# Patient Record
Sex: Male | Born: 1985 | Race: White | Hispanic: No | Marital: Married | Smoking: Former smoker
Health system: Southern US, Community
[De-identification: ages and names within clinical notes are randomized; demographics above are authoritative.]

## PROBLEM LIST (undated history)

## (undated) HISTORY — PX: FRACTURE SURGERY: SHX138

---
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
## 2003-05-23 ENCOUNTER — Emergency Department (HOSPITAL_COMMUNITY): Admission: EM | Admit: 2003-05-23 | Discharge: 2003-05-23 | Payer: Self-pay | Admitting: Emergency Medicine

## 2003-08-21 ENCOUNTER — Emergency Department (HOSPITAL_COMMUNITY): Admission: EM | Admit: 2003-08-21 | Discharge: 2003-08-21 | Payer: Self-pay

## 2004-04-05 ENCOUNTER — Emergency Department (HOSPITAL_COMMUNITY): Admission: EM | Admit: 2004-04-05 | Discharge: 2004-04-05 | Payer: Self-pay | Admitting: Emergency Medicine

## 2004-06-13 ENCOUNTER — Emergency Department (HOSPITAL_COMMUNITY): Admission: EM | Admit: 2004-06-13 | Discharge: 2004-06-14 | Payer: Self-pay | Admitting: *Deleted

## 2004-12-08 ENCOUNTER — Emergency Department (HOSPITAL_COMMUNITY): Admission: EM | Admit: 2004-12-08 | Discharge: 2004-12-08 | Payer: Self-pay | Admitting: Emergency Medicine

## 2006-02-23 ENCOUNTER — Emergency Department (HOSPITAL_COMMUNITY): Admission: EM | Admit: 2006-02-23 | Discharge: 2006-02-23 | Payer: Self-pay | Admitting: Emergency Medicine

## 2007-07-23 ENCOUNTER — Emergency Department (HOSPITAL_COMMUNITY): Admission: EM | Admit: 2007-07-23 | Discharge: 2007-07-23 | Payer: Self-pay | Admitting: Emergency Medicine

## 2008-07-08 ENCOUNTER — Encounter: Payer: Self-pay | Admitting: Orthopedic Surgery

## 2008-07-08 ENCOUNTER — Emergency Department (HOSPITAL_COMMUNITY): Admission: EM | Admit: 2008-07-08 | Discharge: 2008-07-08 | Payer: Self-pay | Admitting: Emergency Medicine

## 2008-07-14 ENCOUNTER — Ambulatory Visit: Payer: Self-pay | Admitting: Orthopedic Surgery

## 2008-07-14 DIAGNOSIS — M549 Dorsalgia, unspecified: Secondary | ICD-10-CM | POA: Insufficient documentation

## 2008-07-15 ENCOUNTER — Encounter: Payer: Self-pay | Admitting: Orthopedic Surgery

## 2008-07-28 ENCOUNTER — Encounter (HOSPITAL_COMMUNITY): Admission: RE | Admit: 2008-07-28 | Discharge: 2008-08-27 | Payer: Self-pay | Admitting: Orthopedic Surgery

## 2008-08-03 ENCOUNTER — Encounter: Payer: Self-pay | Admitting: Orthopedic Surgery

## 2008-08-04 ENCOUNTER — Encounter: Payer: Self-pay | Admitting: Orthopedic Surgery

## 2008-08-13 ENCOUNTER — Encounter (INDEPENDENT_AMBULATORY_CARE_PROVIDER_SITE_OTHER): Payer: Self-pay | Admitting: *Deleted

## 2010-08-23 ENCOUNTER — Emergency Department (HOSPITAL_COMMUNITY)
Admission: EM | Admit: 2010-08-23 | Discharge: 2010-08-23 | Disposition: A | Discharge status: Home / Self Care | Payer: Self-pay | Attending: Emergency Medicine | Admitting: Emergency Medicine

## 2010-08-23 ENCOUNTER — Emergency Department (HOSPITAL_COMMUNITY): Payer: Self-pay

## 2010-08-23 DIAGNOSIS — Z87891 Personal history of nicotine dependence: Secondary | ICD-10-CM | POA: Insufficient documentation

## 2010-08-23 DIAGNOSIS — R51 Headache: Secondary | ICD-10-CM | POA: Insufficient documentation

## 2011-05-19 ENCOUNTER — Encounter (HOSPITAL_COMMUNITY): Payer: Self-pay | Admitting: *Deleted

## 2011-05-19 ENCOUNTER — Emergency Department (HOSPITAL_COMMUNITY)
Admission: EM | Admit: 2011-05-19 | Discharge: 2011-05-19 | Disposition: A | Discharge status: Home / Self Care | Payer: Self-pay | Attending: Emergency Medicine | Admitting: Emergency Medicine

## 2011-05-19 ENCOUNTER — Other Ambulatory Visit: Payer: Self-pay

## 2011-05-19 ENCOUNTER — Emergency Department (HOSPITAL_COMMUNITY): Payer: Self-pay

## 2011-05-19 DIAGNOSIS — J45909 Unspecified asthma, uncomplicated: Secondary | ICD-10-CM | POA: Insufficient documentation

## 2011-05-19 DIAGNOSIS — R091 Pleurisy: Secondary | ICD-10-CM | POA: Insufficient documentation

## 2011-05-19 DIAGNOSIS — F17200 Nicotine dependence, unspecified, uncomplicated: Secondary | ICD-10-CM | POA: Insufficient documentation

## 2011-05-19 DIAGNOSIS — R079 Chest pain, unspecified: Secondary | ICD-10-CM | POA: Insufficient documentation

## 2011-05-19 MED ORDER — HYDROCODONE-ACETAMINOPHEN 5-325 MG PO TABS: 2.0000 | ORAL_TABLET | ORAL | Status: AC | PRN

## 2011-05-19 MED ORDER — IBUPROFEN 600 MG PO TABS: 600.0000 mg | ORAL_TABLET | Freq: Four times a day (QID) | ORAL | Status: AC | PRN

## 2011-05-19 NOTE — Discharge Instructions (Signed)
Pleurisy  Pleurisy is an inflammation and swelling of the lining of the lungs. It usually is the result of an underlying infection or other disease. Because of this inflammation, it hurts to breathe. It is aggravated by coughing or deep breathing. The primary goal in treating pleurisy is to diagnose and treat the condition that caused it.   HOME CARE INSTRUCTIONS    Only take over-the-counter or prescription medicines for pain, discomfort, or fever as directed by your caregiver.   If medications which kill germs (antibiotics) were prescribed, take the entire course. Even if you are feeling better, you need to take them.   Use a cool mist vaporizer to help loosen secretions. This is so the secretions can be coughed up more easily.  SEEK MEDICAL CARE IF:    Your pain is not controlled with medication or is increasing.   You have an increase inpus like (purulent) secretions brought up with coughing.  SEEK IMMEDIATE MEDICAL CARE IF:    You have blue or dark lips, fingernails, or toenails.   You begin coughing up blood.   You have increased difficulty breathing.   You have continuing pain unrelieved by medicine or lasting more than 1 week.   You have pain that radiates into your neck, arms, or jaw.   You develop increased shortness of breath or wheezing.   You develop a fever, rash, vomiting, fainting, or other serious complaints.  Document Released: 02/06/2005 Document Revised: 01/26/2011 Document Reviewed: 09/07/2006  [employer] Patient Information 2012 [employer].

## 2011-05-19 NOTE — ED Provider Notes (Signed)
History     CSN: [id number]  Arrival date & time 05/19/11  9562   First MD Initiated Contact with Patient 05/19/11 1153      Chief Complaint  Patient presents with  . Chest Pain     HPI Pt reports progressively worsening left sided rib cage discomfort x 3 days. Pt reports increases with deep breathing and standing up straight.  Pain worse with deep breathing and coughing.  No fever cough no wheezing.  No history of ACS or cardiac abnormalities.  Patient is a smoker.  No nausea vomiting.  No diaphoresis.  Past Medical History  Diagnosis Date  . Asthma     History reviewed. No pertinent past surgical history.  History reviewed. No pertinent family history.  History  Substance Use Topics  . Smoking status: Current Everyday Smoker  . Smokeless tobacco: Not on file  . Alcohol Use: No      Review of Systems  All other systems reviewed and are negative.    Allergies  Review of patient's allergies indicates no known allergies.  Home Medications   Current Outpatient Rx  Name Route Sig Dispense Refill  . IBUPROFEN 200 MG PO TABS Oral Take 800 mg by mouth every 6 (six) hours as needed.    Marland Kitchen HYDROCODONE-ACETAMINOPHEN 5-325 MG PO TABS Oral Take 2 tablets by mouth every 4 (four) hours as needed for pain. 10 tablet 0  . IBUPROFEN 600 MG PO TABS Oral Take 1 tablet (600 mg total) by mouth every 6 (six) hours as needed for pain. 30 tablet 0    BP 126/78  Pulse 75  Temp(Src) 98.1 F (36.7 C) (Oral)  Resp 24  SpO2 96%  Physical Exam  Nursing note and vitals reviewed. Constitutional: He is oriented to person, place, and time. He appears well-developed and well-nourished. No distress.  HENT:  Head: Normocephalic and atraumatic.  Eyes: Pupils are equal, round, and reactive to light.  Neck: Normal range of motion.  Cardiovascular: Normal rate and intact distal pulses.         Date: 05/19/2011  Rate: 78  Rhythm: normal sinus rhythm  QRS Axis: normal  Intervals: normal  ST/T Wave abnormalities: normal  Conduction Disutrbances: none  Narrative Interpretation: unremarkable      Pulmonary/Chest: No respiratory distress. He has no wheezes. He has no rales.      Abdominal: Normal appearance. He exhibits no distension.  Musculoskeletal: Normal range of motion.  Neurological: He is alert and oriented to person, place, and time. No cranial nerve deficit.  Skin: Skin is warm and dry. No rash noted.  Psychiatric: He has a normal mood and affect. His behavior is normal.    ED Course  Procedures (including critical care time)   Patient is PERC negative   Labs Reviewed - No data to display Dg Chest 2 View  05/19/2011  *RADIOLOGY REPORT*  Clinical Data: Left rib pain and chest pain.  CHEST - 2 VIEW  Comparison: None.  Findings: Bilateral nipple adornments are present. Cardiopericardial silhouette appears within normal limits.  No airspace disease.  No effusion.  Trachea midline.  There is no pneumothorax.  No displaced rib fractures are identified.  IMPRESSION: No active cardiopulmonary disease.  Original Report Authenticated By: Andreas Newport, M.D.     1. Pleurisy       MDM          Nelia Shi, MD 05/19/11 [phone]

## 2011-05-19 NOTE — ED Notes (Signed)
Pt reports progressively worsening left sided rib cage discomfort x 3 days. Pt reports increases with deep breathing and standing up straight.

## 2012-10-26 ENCOUNTER — Encounter (HOSPITAL_COMMUNITY): Payer: Self-pay | Admitting: *Deleted

## 2012-10-26 ENCOUNTER — Emergency Department (HOSPITAL_COMMUNITY): Payer: BC Managed Care – PPO

## 2012-10-26 ENCOUNTER — Emergency Department (HOSPITAL_COMMUNITY)
Admission: EM | Admit: 2012-10-26 | Discharge: 2012-10-26 | Disposition: A | Discharge status: Home / Self Care | Payer: BC Managed Care – PPO | Attending: Emergency Medicine | Admitting: Emergency Medicine

## 2012-10-26 DIAGNOSIS — S8990XA Unspecified injury of unspecified lower leg, initial encounter: Secondary | ICD-10-CM | POA: Insufficient documentation

## 2012-10-26 DIAGNOSIS — Y9239 Other specified sports and athletic area as the place of occurrence of the external cause: Secondary | ICD-10-CM | POA: Insufficient documentation

## 2012-10-26 DIAGNOSIS — X500XXA Overexertion from strenuous movement or load, initial encounter: Secondary | ICD-10-CM | POA: Insufficient documentation

## 2012-10-26 DIAGNOSIS — Y9367 Activity, basketball: Secondary | ICD-10-CM | POA: Insufficient documentation

## 2012-10-26 DIAGNOSIS — S8992XA Unspecified injury of left lower leg, initial encounter: Secondary | ICD-10-CM

## 2012-10-26 DIAGNOSIS — J45901 Unspecified asthma with (acute) exacerbation: Secondary | ICD-10-CM | POA: Insufficient documentation

## 2012-10-26 DIAGNOSIS — F17200 Nicotine dependence, unspecified, uncomplicated: Secondary | ICD-10-CM | POA: Insufficient documentation

## 2012-10-26 MED ORDER — KETOROLAC TROMETHAMINE 10 MG PO TABS
10.0000 mg | ORAL_TABLET | Freq: Once | ORAL | Status: AC
  Administered 2012-10-26: 10 mg via ORAL
  Filled 2012-10-26: qty 1

## 2012-10-26 MED ORDER — HYDROCODONE-ACETAMINOPHEN 7.5-325 MG PO TABS: 1.0000 | ORAL_TABLET | ORAL | Status: DC | PRN

## 2012-10-26 MED ORDER — ONDANSETRON HCL 4 MG PO TABS
4.0000 mg | ORAL_TABLET | Freq: Once | ORAL | Status: AC
  Administered 2012-10-26: 4 mg via ORAL
  Filled 2012-10-26: qty 1

## 2012-10-26 MED ORDER — HYDROCODONE-ACETAMINOPHEN 5-325 MG PO TABS
2.0000 | ORAL_TABLET | Freq: Once | ORAL | Status: AC
  Administered 2012-10-26: 2 via ORAL
  Filled 2012-10-26: qty 2

## 2012-10-26 MED ORDER — DICLOFENAC SODIUM 75 MG PO TBEC: 75.0000 mg | DELAYED_RELEASE_TABLET | Freq: Two times a day (BID) | ORAL | Status: DC

## 2012-10-26 NOTE — ED Notes (Signed)
Pt c/o bending left knee backwards while playing basketball last night, positive distal pulse,

## 2012-10-26 NOTE — ED Notes (Signed)
Pt alert & oriented x4, stable gait with crutches. Patient given discharge instructions, paperwork & prescription(s). Patient instructed to stop at the registration desk to finish any additional paperwork. Patient verbalized understanding. Pt left department w/ no further questions.

## 2012-10-26 NOTE — ED Provider Notes (Signed)
Medical screening examination/treatment/procedure(s) were performed by non-physician practitioner and as supervising physician I was immediately available for consultation/collaboration.  Nikesha Kwasny, MD 10/26/12 1537 

## 2012-10-26 NOTE — ED Notes (Signed)
Pt reports was playing basketball last night and when he landed on feet his left knee went "backwards."

## 2012-10-26 NOTE — ED Provider Notes (Signed)
CSN: [id number]     Arrival date & time 10/26/12  0735 History   First MD Initiated Contact with Patient 10/26/12 0801     Chief Complaint  Patient presents with  . Knee Pain   (Consider location/radiation/quality/duration/timing/severity/associated sxs/prior Treatment) Patient is a 27 y.o. male presenting with knee pain. The history is provided by the patient.  Knee Pain Location:  Knee Time since incident:  1 day Injury: yes   Mechanism of injury comment:  Stretched left knee backwards while playing basketball Knee location:  L knee Pain details:    Quality:  Aching   Radiates to:  Does not radiate   Severity:  Moderate   Onset quality:  Sudden   Duration:  1 day   Timing:  Constant   Progression:  Worsening Chronicity:  New Dislocation: no   Foreign body present:  No foreign bodies Prior injury to area:  No Relieved by:  Nothing Ineffective treatments:  NSAIDs Associated symptoms: decreased ROM and stiffness   Associated symptoms: no back pain, no neck pain, no numbness and no tingling     Past Medical History  Diagnosis Date  . Asthma    History reviewed. No pertinent past surgical history. No family history on file. History  Substance Use Topics  . Smoking status: Current Every Day Smoker  . Smokeless tobacco: Not on file  . Alcohol Use: No    Review of Systems  Constitutional: Negative for activity change.       All ROS Neg except as noted in HPI  HENT: Negative for nosebleeds and neck pain.   Eyes: Negative for photophobia and discharge.  Respiratory: Positive for wheezing. Negative for cough and shortness of breath.   Cardiovascular: Negative for chest pain and palpitations.  Gastrointestinal: Negative for abdominal pain and blood in stool.  Genitourinary: Negative for dysuria, frequency and hematuria.  Musculoskeletal: Positive for arthralgias and stiffness. Negative for back pain.  Skin: Negative.   Neurological: Negative for dizziness, seizures and  speech difficulty.  Psychiatric/Behavioral: Negative for hallucinations and confusion.    Allergies  Shellfish allergy  Home Medications   Current Outpatient Rx  Name  Route  Sig  Dispense  Refill  . ibuprofen (ADVIL,MOTRIN) 200 MG tablet   Oral   Take by mouth every 6 (six) hours as needed.           BP 101/61  Pulse 79  Temp(Src) 98.6 F (37 C) (Oral)  Resp 16  Ht 6' (1.829 m)  Wt 214 lb (97.07 kg)  BMI 29.02 kg/m2  SpO2 95% Physical Exam  Nursing note and vitals reviewed. Constitutional: He is oriented to person, place, and time. He appears well-developed and well-nourished.  Non-toxic appearance.  HENT:  Head: Normocephalic.  Right Ear: Tympanic membrane and external ear normal.  Left Ear: Tympanic membrane and external ear normal.  Eyes: EOM and lids are normal. Pupils are equal, round, and reactive to light.  Neck: Normal range of motion. Neck supple. Carotid bruit is not present.  Cardiovascular: Normal rate, regular rhythm, normal heart sounds, intact distal pulses and normal pulses.   Pulmonary/Chest: Breath sounds normal. No respiratory distress.  Abdominal: Soft. Bowel sounds are normal. There is no tenderness. There is no guarding.  Musculoskeletal: Normal range of motion.  There is full range of motion of the left hip. There is pain to palpation of the left knee. Medial more than lateral. There is some pain to palpation posteriorly. There is mild instability of the left  knee, examination is limited due to to patient's pain. The left knee is not hot. The Achilles tendon is intact. The dorsalis pedis pulses 2+.  Lymphadenopathy:       Head (right side): No submandibular adenopathy present.       Head (left side): No submandibular adenopathy present.    He has no cervical adenopathy.  Neurological: He is alert and oriented to person, place, and time. He has normal strength. No cranial nerve deficit or sensory deficit.  Skin: Skin is warm and dry.  Psychiatric:  He has a normal mood and affect. His speech is normal.    ED Course  Procedures (including critical care time) Labs Review Labs Reviewed - No data to display Imaging Review No results found.  MDM  No diagnosis found. **I have reviewed nursing notes, vital signs, and all appropriate lab and imaging results for this patient.* Patient was playing basketball when he hyperextended the left knee after coming down from jumping. He heard a pop, and was unable to finish the play. His examination question some mild instability of the knee, however this examination was limited due to the patient's pain. The x-ray of the left knee is negative for fracture or dislocation. I am concerned for possible ligament injury to this left knee. The patient is fitted with a knee immobilizer, crutches, an ice pack. Prescription for diclofenac and Norco given to the patient. Work note to clear the patient for work duty until cleared by orthopedics has been given to the patient.   Kathie Dike, PA-C 10/26/12 [phone]

## 2012-10-29 ENCOUNTER — Encounter: Payer: Self-pay | Admitting: Orthopedic Surgery

## 2012-10-29 ENCOUNTER — Ambulatory Visit (INDEPENDENT_AMBULATORY_CARE_PROVIDER_SITE_OTHER): Payer: BC Managed Care – PPO | Admitting: Orthopedic Surgery

## 2012-10-29 VITALS — BP 117/80 | Ht 72.0 in | Wt 212.0 lb

## 2012-10-29 DIAGNOSIS — IMO0002 Reserved for concepts with insufficient information to code with codable children: Secondary | ICD-10-CM

## 2012-10-29 DIAGNOSIS — S83242A Other tear of medial meniscus, current injury, left knee, initial encounter: Secondary | ICD-10-CM

## 2012-10-29 DIAGNOSIS — S83249A Other tear of medial meniscus, current injury, unspecified knee, initial encounter: Secondary | ICD-10-CM | POA: Insufficient documentation

## 2012-10-29 MED ORDER — DICLOFENAC SODIUM 75 MG PO TBEC: 75.0000 mg | DELAYED_RELEASE_TABLET | Freq: Two times a day (BID) | ORAL | Status: DC

## 2012-10-29 NOTE — Progress Notes (Signed)
Patient ID: James Hendricks, male   DOB: December 04, 1985, 27 y.o.   MRN: [id number]  Chief Complaint  Patient presents with  . Knee Pain    Left knee pain d/t injury 10/26/12    History date of injury August 5. Mechanism hyperextension while playing basketball. The patient planes of sharp dull throbbing stabbing for 10 constant pain and inability to weight-bear even with crutches and knee brace after injuring his left knee he heard a pop complains of swelling and lateral joint pain as well as joint effusion  Review of systems is negative except for shellfish and snoring  His medical history is recorded.  BP 117/80  Ht 6' (1.829 m)  Wt 212 lb (96.163 kg)  BMI 28.75 kg/m2 His appearance is normal he is oriented x3 his mood is normal his gait is normal.  Inspection reveals  moderate joint effusion painful range of motion 45 anterior cruciate ligament appears intact motor function is normal scans intact collateral ligaments are stable patella stable he is tender over the medial joint line his McMurray sign is positive for a medial  a medial meniscal tear skin is intact pulses normal good sensation is noted distal  X-rays are negative  Encounter Diagnosis  Name Primary?  . Acute medial meniscal tear, left, initial encounter Yes    Plan MRI continue ice brace and crutches anti-inflammatories follow up after MRI the patient most likely has a torn medial meniscus and will require surgery

## 2012-10-29 NOTE — Patient Instructions (Addendum)
MRI  OOW UNTIL MRI COMPLETED   IF THE MENISCUS IS TORN YOU MAY NEED SURGERY PLEASE READ BELOW   Meniscus Injury of the Knee, Arthroscopy You may have an internal derangement of the knee. This means something is wrong inside the knee. Your caregiver can make a more accurate diagnosis (learning what is wrong) by performing an arthroscopic procedure. Your knee has two layers of cartilage. Articular cartilage covers the bone ends. It lets your knee bend and move smoothly. Two menisci (thick pads of cartilage that form a rim inside the joint) help absorb shock. They stabilize your knee. Ligaments bind the bones together. They support your knee joint. Muscles move the joint, help support your knee, and take stress off the joint itself.  ABOUT THE PROCEDURE Arthroscopy is a surgical technique. It allows your orthopedic surgeon to diagnose and treat your knee injury with accuracy. The surgeon looks into your knee through a small scope. The scope is like a small (pencil-sized) telescope. Arthroscopy is less invasive than open knee surgery. You can expect a more rapid recovery. Following your caregiver's instructions will help you recover rapidly and completely. Use crutches, rest, elevate, ice, and do knee exercises as instructed. The length of recovery depends on various factors. These factors include type of injury, age, physical condition, medical conditions, and your determination. How long you will be away from your normal activities will depend on what kind of knee problem you have. It will also depend on how much tissue is damaged. Rebuilding your muscles after arthroscopy helps ensure a full recovery. RECOVERY Recovery after a meniscus injury depends on how much meniscus is damaged. It also depends on whether or not you have damaged other knee tissue. With small tears, your recovery may take a couple weeks. Larger tears will take longer. Meniscus injuries can usually be treated during arthroscopy. If  your injury is on the inner edge of the meniscus, your surgeon may trim the meniscus back to a smooth rim. In other cases, your surgeon will try to repair a damaged meniscus with sutures (stitches). This may lengthen your rehabilitation. It may provide better long-term health by helping your knee retain its shock absorption abilities. Use crutches, limit weight bearing, rest, elevate, apply ice, and exercise your knee as instructed. If a brace is applied, use as directed. The length of recovery depends on various factors including type of injury, age, physical condition, other medical conditions, and your determination. Your caregiver will help with instructions for rehabilitation of your knee. HOME CARE INSTRUCTIONS  Use crutches and knee exercises as instructed.  Applying an ice pack to your operative site may help with discomfort. It may also keep the swelling down.  Only take over-the-counter or prescription medicines for pain, discomfort, inflammation (soreness)or fever as directed by your caregiver. You may use these only if your caregiver has not given medications that would interfere.  You may resume normal diet and activities as directed. SEEK MEDICAL ATTENTION IF:  There is increased bleeding (more than a small spot) from the wound.  You notice redness, swelling, or increasing pain in the wound.  Pus is coming from wound.  An unexplained oral temperature above 102 F (38.9 C) develops, or as your caregiver suggests.  You notice a foul smell coming from the wound or dressing. SEEK IMMEDIATE MEDICAL CARE IF:  You develop a rash.  You have difficulty breathing.  You have any allergic problems. Document Released: 02/04/2000 Document Revised: 05/01/2011 Document Reviewed: 04/22/2007 [employer] Patient Information 2014  [employer]

## 2012-10-30 ENCOUNTER — Telehealth: Payer: Self-pay | Admitting: *Deleted

## 2012-10-30 NOTE — Telephone Encounter (Signed)
Authorization # [id number] per Christy Sartorius. With [employer] for CPT code 47829 Left Knee without contrast Valid through 10/30/12 - 11/28/12. Scheduled for 11/01/12 at 4:45 pm Follow up with Dr. Romeo Apple 11/12/12 at 3:45 pm Patient is aware of dates and times

## 2012-11-01 ENCOUNTER — Ambulatory Visit (HOSPITAL_COMMUNITY)
Admission: RE | Admit: 2012-11-01 | Discharge: 2012-11-01 | Disposition: A | Discharge status: Home / Self Care | Payer: BC Managed Care – PPO | Source: Ambulatory Visit | Attending: Orthopedic Surgery | Admitting: Orthopedic Surgery

## 2012-11-01 DIAGNOSIS — S82109A Unspecified fracture of upper end of unspecified tibia, initial encounter for closed fracture: Secondary | ICD-10-CM | POA: Insufficient documentation

## 2012-11-01 DIAGNOSIS — X58XXXA Exposure to other specified factors, initial encounter: Secondary | ICD-10-CM | POA: Insufficient documentation

## 2012-11-01 DIAGNOSIS — Y9361 Activity, american tackle football: Secondary | ICD-10-CM | POA: Insufficient documentation

## 2012-11-01 DIAGNOSIS — S83242A Other tear of medial meniscus, current injury, left knee, initial encounter: Secondary | ICD-10-CM

## 2012-11-01 DIAGNOSIS — M25569 Pain in unspecified knee: Secondary | ICD-10-CM | POA: Insufficient documentation

## 2012-11-05 ENCOUNTER — Telehealth: Payer: Self-pay | Admitting: Orthopedic Surgery

## 2012-11-05 NOTE — Telephone Encounter (Signed)
Tennis Must asked if you will call him with his MRI results.  His follow-up appointment is scheduled for 11/12/12. His phone # [phone]

## 2012-11-06 NOTE — Telephone Encounter (Signed)
Here are the results  Call him and read them   Schedule the follow up   Tell him no surgery needed   IMPRESSION: Lateral tibial plateau fracture without obvious displacement or depression   Intact ligamentous structures and no meniscal tears. Probable anterior horn lateral meniscus contusion.   Intact articular cartilage.   Small joint effusion.

## 2012-11-06 NOTE — Telephone Encounter (Signed)
Patient informed of Dr. Mort Sawyers reply. Advised to keep appointment 11/12/12

## 2012-11-12 ENCOUNTER — Encounter: Payer: Self-pay | Admitting: Orthopedic Surgery

## 2012-11-12 ENCOUNTER — Ambulatory Visit (INDEPENDENT_AMBULATORY_CARE_PROVIDER_SITE_OTHER): Payer: BC Managed Care – PPO | Admitting: Orthopedic Surgery

## 2012-11-12 DIAGNOSIS — S8290XD Unspecified fracture of unspecified lower leg, subsequent encounter for closed fracture with routine healing: Secondary | ICD-10-CM

## 2012-11-12 DIAGNOSIS — S82142D Displaced bicondylar fracture of left tibia, subsequent encounter for closed fracture with routine healing: Secondary | ICD-10-CM

## 2012-11-12 DIAGNOSIS — M25569 Pain in unspecified knee: Secondary | ICD-10-CM

## 2012-11-12 DIAGNOSIS — M25562 Pain in left knee: Secondary | ICD-10-CM

## 2012-11-13 ENCOUNTER — Encounter: Payer: Self-pay | Admitting: Orthopedic Surgery

## 2012-11-13 DIAGNOSIS — S82143A Displaced bicondylar fracture of unspecified tibia, initial encounter for closed fracture: Secondary | ICD-10-CM | POA: Insufficient documentation

## 2012-11-13 DIAGNOSIS — M25562 Pain in left knee: Secondary | ICD-10-CM | POA: Insufficient documentation

## 2012-11-13 NOTE — Progress Notes (Signed)
Patient ID: James Hendricks, male   DOB: Nov 05, 1985, 27 y.o.   MRN: [id number]  Chief Complaint  Patient presents with  . Follow-up    mri f/u     There were no vitals taken for this visit.  The patient comes in for MRI followup after hyperextension injury to the left knee. He says his knee feels much better. His MRI shows impaction injury and fracture of the proximal tibia with impaction injury of the distal femur on the lateral side of his knee. This was probably headed towards an anterior cruciate ligament tear the PCL is intact menisci are fine  His clinical exam reveals  General appearance is normal, the patient is alert and oriented x3 with normal mood and affect. Left knee minimal tenderness in the lateral compartment lateral femoral condyle and tibial plateau. He has full range of motion and he remained stable. In the trochanter and is normal without assistive device  Impression fracture lateral tibia trabecular type pattern with femoral impaction injury.  Recommend hinged knee brace recheck me in 4 weeks okay for patient to return to work

## 2012-12-10 ENCOUNTER — Ambulatory Visit: Payer: BC Managed Care – PPO | Admitting: Orthopedic Surgery

## 2012-12-10 ENCOUNTER — Encounter: Payer: Self-pay | Admitting: Orthopedic Surgery

## 2012-12-26 ENCOUNTER — Other Ambulatory Visit: Payer: Self-pay

## 2014-12-10 ENCOUNTER — Emergency Department (HOSPITAL_COMMUNITY)
Admission: EM | Admit: 2014-12-10 | Discharge: 2014-12-10 | Disposition: A | Discharge status: Home / Self Care | Payer: BLUE CROSS/BLUE SHIELD | Attending: Emergency Medicine | Admitting: Emergency Medicine

## 2014-12-10 DIAGNOSIS — R590 Localized enlarged lymph nodes: Secondary | ICD-10-CM | POA: Diagnosis not present

## 2014-12-10 DIAGNOSIS — J45909 Unspecified asthma, uncomplicated: Secondary | ICD-10-CM | POA: Diagnosis not present

## 2014-12-10 DIAGNOSIS — N50819 Testicular pain, unspecified: Secondary | ICD-10-CM | POA: Diagnosis not present

## 2014-12-10 DIAGNOSIS — Z791 Long term (current) use of non-steroidal anti-inflammatories (NSAID): Secondary | ICD-10-CM | POA: Diagnosis not present

## 2014-12-10 DIAGNOSIS — R1030 Lower abdominal pain, unspecified: Secondary | ICD-10-CM | POA: Diagnosis present

## 2014-12-10 DIAGNOSIS — N5089 Other specified disorders of the male genital organs: Secondary | ICD-10-CM | POA: Diagnosis not present

## 2014-12-10 DIAGNOSIS — Z720 Tobacco use: Secondary | ICD-10-CM | POA: Diagnosis not present

## 2014-12-10 NOTE — ED Provider Notes (Signed)
CSN: [id number]     Arrival date & time 12/10/14  0909 History  By signing my name below, I, Doreatha Martin, attest that this documentation has been prepared under the direction and in the presence of Zadie Rhine, MD. Electronically Signed: Doreatha Martin, ED Scribe. 12/10/2014. 9:30 AM.    Chief Complaint  Patient presents with  . Groin Pain   The history is provided by the patient. No language interpreter was used.    HPI Comments: James Hendricks is a 29 y.o. male who presents to the Emergency Department complaining of a gradual onset area of swelling to the left groin that began 3 days ago. He states the area is tender with palpation and with ambulation. No known trauma or injury to the area. Otherwise healthy. He denies fever, vomiting, dysuria, difficulty urination, chills, night sweats, unintentional weight loss.   Past Medical History  Diagnosis Date  . Asthma    No past surgical history on file. No family history on file. Social History  Substance Use Topics  . Smoking status: Current Every Day Smoker  . Smokeless tobacco: Not on file  . Alcohol Use: No    Review of Systems  Constitutional: Negative for fever, chills and unexpected weight change.  Gastrointestinal: Negative for vomiting.  Genitourinary: Positive for scrotal swelling and testicular pain. Negative for dysuria and difficulty urinating.   Allergies  Shellfish allergy  Home Medications   Prior to Admission medications   Medication Sig Start Date End Date Taking? Authorizing Provider  diclofenac (VOLTAREN) 75 MG EC tablet Take 1 tablet (75 mg total) by mouth 2 (two) times daily. 10/29/12   Vickki Hearing, MD  HYDROcodone-acetaminophen (NORCO) 7.5-325 MG per tablet Take 1 tablet by mouth every 4 (four) hours as needed for pain. 10/26/12   Ivery Quale, PA-C  ibuprofen (ADVIL,MOTRIN) 200 MG tablet Take 800 mg by mouth every 6 (six) hours as needed for pain or headache.     Historical Provider, MD   BP 121/81  mmHg  Pulse 63  Temp(Src) 97.4 F (36.3 C) (Oral)  Resp 20  Ht  (1.854 m)  Wt 213 lb (96.616 kg)  BMI 28.11 kg/m2  SpO2 100%  Physical Exam CONSTITUTIONAL: Well developed/well nourished HEAD: Normocephalic/atraumatic EYES: EOMI/PERRL ENMT: Mucous membranes moist NECK: supple no meningeal signs SPINE/BACK:entire spine nontender CV: S1/S2 noted, no murmurs/rubs/gallops noted LUNGS: Lungs are clear to auscultation bilaterally, no apparent distress ABDOMEN: soft, nontender, no rebound or guarding, bowel sounds noted throughout abdomen GU:no cva tenderness. Small lymph node noted to left inguinal crease. No hernia noted. No erythema or warmth. No testicular tenderness or mass noted. No penile discharge noted.  NEURO: Pt is awake/alert/appropriate, moves all extremitiesx4.  No facial droop.   EXTREMITIES: pulses normal/equal, full ROM SKIN: warm, color normal PSYCH: no abnormalities of mood noted, alert and oriented to situation  ED Course  Procedures  DIAGNOSTIC STUDIES: Oxygen Saturation is 100% on RA, normal by my interpretation.    COORDINATION OF CARE: 9:22 AM Discussed treatment plan with pt at bedside and pt agreed to plan. Pt well appearing Isolated inguinal lymphadenopathy Otherwise well appearing Discussed possibility (though unlikely) of cancer Referred to PCP Pt stable for d/c home   MDM   Final diagnoses:  Lymphadenopathy, inguinal    Nursing notes including past medical history and social history reviewed and considered in documentation  I, Joya Gaskins, personally performed the services described in this documentation. All medical record entries made by the scribe were  at my direction and in my presence.  I have reviewed the chart and discharge instructions and agree that the record reflects my personal performance and is accurate and complete. Joya GaskinsWICKLINE,Syniyah Bourne W.  12/10/2014. 10:18 AM.       Zadie Rhineonald Anaja Monts, MD 12/10/14 1018

## 2014-12-10 NOTE — ED Notes (Signed)
Pt states he has a knot on the left groin. Denies pain at present or any penile discharge

## 2014-12-10 NOTE — Discharge Instructions (Signed)

## 2019-12-14 ENCOUNTER — Other Ambulatory Visit: Payer: Self-pay

## 2019-12-14 ENCOUNTER — Emergency Department (HOSPITAL_COMMUNITY)
Admission: EM | Admit: 2019-12-14 | Discharge: 2019-12-14 | Disposition: A | Discharge status: Home / Self Care | Payer: Self-pay | Attending: Emergency Medicine | Admitting: Emergency Medicine

## 2019-12-14 ENCOUNTER — Emergency Department (HOSPITAL_COMMUNITY): Payer: Self-pay

## 2019-12-14 ENCOUNTER — Encounter (HOSPITAL_COMMUNITY): Payer: Self-pay | Admitting: *Deleted

## 2019-12-14 DIAGNOSIS — K5732 Diverticulitis of large intestine without perforation or abscess without bleeding: Secondary | ICD-10-CM | POA: Insufficient documentation

## 2019-12-14 DIAGNOSIS — J45909 Unspecified asthma, uncomplicated: Secondary | ICD-10-CM | POA: Insufficient documentation

## 2019-12-14 DIAGNOSIS — R1084 Generalized abdominal pain: Secondary | ICD-10-CM

## 2019-12-14 DIAGNOSIS — F17290 Nicotine dependence, other tobacco product, uncomplicated: Secondary | ICD-10-CM | POA: Insufficient documentation

## 2019-12-14 LAB — COMPREHENSIVE METABOLIC PANEL
ALT: 34 U/L (ref 0–44)
AST: 19 U/L (ref 15–41)
Albumin: 4.4 g/dL (ref 3.5–5.0)
Alkaline Phosphatase: 56 U/L (ref 38–126)
Anion gap: 7 (ref 5–15)
BUN: 12 mg/dL (ref 6–20)
CO2: 29 mmol/L (ref 22–32)
Calcium: 9.2 mg/dL (ref 8.9–10.3)
Chloride: 102 mmol/L (ref 98–111)
Creatinine, Ser: 0.95 mg/dL (ref 0.61–1.24)
GFR, Estimated: >60 mL/min (ref >60)
Glucose, Bld: 108 mg/dL — ABNORMAL HIGH (ref 70–99)
Potassium: 4 mmol/L (ref 3.5–5.1)
Sodium: 138 mmol/L (ref 135–145)
Total Bilirubin: 0.7 mg/dL (ref 0.3–1.2)
Total Protein: 7.9 g/dL (ref 6.5–8.1)

## 2019-12-14 LAB — CBC
HCT: 45.1 % (ref 39.0–52.0)
Hemoglobin: 15.3 g/dL (ref 13.0–17.0)
MCH: 30.2 pg (ref 26.0–34.0)
MCHC: 33.9 g/dL (ref 30.0–36.0)
MCV: 89 fL (ref 80.0–100.0)
Platelets: 219 10*3/uL (ref 150–400)
RBC: 5.07 MIL/uL (ref 4.22–5.81)
RDW: 12.8 % (ref 11.5–15.5)
WBC: 13 10*3/uL — ABNORMAL HIGH (ref 4.0–10.5)
nRBC: 0 % (ref 0.0–0.2)

## 2019-12-14 LAB — LIPASE, BLOOD: Lipase: 22 U/L (ref 11–51)

## 2019-12-14 MED ORDER — ONDANSETRON 4 MG PO TBDP: 4.0000 mg | ORAL_TABLET | Freq: Three times a day (TID) | ORAL | 0 refills | Status: DC | PRN

## 2019-12-14 MED ORDER — IOHEXOL 300 MG/ML  SOLN
100.0000 mL | Freq: Once | INTRAMUSCULAR | Status: AC | PRN
  Administered 2019-12-14: 100 mL via INTRAVENOUS

## 2019-12-14 MED ORDER — SODIUM CHLORIDE 0.9 % IV BOLUS
1000.0000 mL | Freq: Once | INTRAVENOUS | Status: AC
  Administered 2019-12-14: 1000 mL via INTRAVENOUS

## 2019-12-14 MED ORDER — AMOXICILLIN-POT CLAVULANATE 875-125 MG PO TABS
1.0000 | ORAL_TABLET | Freq: Once | ORAL | Status: AC
  Administered 2019-12-14: 1 via ORAL
  Filled 2019-12-14: qty 1

## 2019-12-14 MED ORDER — HYDROCODONE-ACETAMINOPHEN 5-325 MG PO TABS: 1.0000 | ORAL_TABLET | Freq: Four times a day (QID) | ORAL | 0 refills | Status: DC | PRN

## 2019-12-14 MED ORDER — AMOXICILLIN-POT CLAVULANATE 875-125 MG PO TABS: 1.0000 | ORAL_TABLET | Freq: Two times a day (BID) | ORAL | 0 refills | Status: DC

## 2019-12-14 NOTE — ED Provider Notes (Signed)
[facility] EMERGENCY DEPARTMENT Provider Note   CSN: [id number] Arrival date & time: 12/14/19  1529     History Chief Complaint  Patient presents with  . Abdominal Pain    James Hendricks is a 34 y.o. malewith no pertinent past medical history who presents today for evaluation of abdominal pain.  He reports that this morning he started having pain in his lower abdomen.  He states that the pain waxes and wanes however has been overall improving.  He rates when the pain is severe a 10 out of 10.  It feels like it starts in his "guts."  He indicates his middle abdomen.  He denies any nausea vomiting or diarrhea.  He does note his last bowel movement was this morning, and that he often times feels like he needs to have a bowel movement however when he tries he is unable to.  He states that in the past he has had similar episodes with some mild dysuria making him think he may have kidney stones however has never been evaluated for this before.  He denies any fevers.  He is not vaccinated against Covid, denies any cough, chest pain, shortness of breath or known sick contacts.  He denies dysuria frequency or urgency.  No penile or testicular pain or swelling.   No history of prior abdominal surgery. HPI     Past Medical History:  Diagnosis Date  . Asthma     Patient Active Problem List   Diagnosis Date Noted  . Tibial plateau fracture 11/13/2012  . Left knee pain 11/13/2012  . Acute medial meniscal tear 10/29/2012  . BACK PAIN, ACUTE 07/14/2008    History reviewed. No pertinent surgical history.     No family history on file.  Social History   Tobacco Use  . Smoking status: Former Games developer  . Smokeless tobacco: Current User  Vaping Use  . Vaping Use: Every day  Substance Use Topics  . Alcohol use: No  . Drug use: Not Currently    Home Medications Prior to Admission medications   Medication Sig Start Date End Date Taking? Authorizing Provider  amoxicillin-clavulanate  (AUGMENTIN) 875-125 MG tablet Take 1 tablet by mouth every 12 (twelve) hours. 12/14/19   Cristina Gong, PA-C  HYDROcodone-acetaminophen (NORCO/VICODIN) 5-325 MG tablet Take 1 tablet by mouth every 6 (six) hours as needed for severe pain. 12/14/19   Cristina Gong, PA-C  ibuprofen (ADVIL,MOTRIN) 200 MG tablet Take 800 mg by mouth every 6 (six) hours as needed for pain or headache.     [provider]  ondansetron (ZOFRAN ODT) 4 MG disintegrating tablet Take 1 tablet (4 mg total) by mouth every 8 (eight) hours as needed for nausea or vomiting. 12/14/19   Cristina Gong, PA-C    Allergies    Shellfish allergy  Review of Systems   Review of Systems  Constitutional: Negative for chills, fatigue and fever.  HENT: Negative for congestion.   Respiratory: Negative for cough and shortness of breath.   Cardiovascular: Negative for chest pain.  Gastrointestinal: Positive for abdominal pain and constipation. Negative for blood in stool, diarrhea, nausea and vomiting.  Genitourinary: Negative for difficulty urinating, discharge, dysuria, frequency, hematuria, penile pain, testicular pain and urgency.  Musculoskeletal: Negative for back pain and neck pain.  Skin: Negative for color change and rash.  Neurological: Negative for weakness and headaches.  All other systems reviewed and are negative.   Physical Exam Updated Vital Signs BP 121/79  Pulse 96   Temp 98.1 F (36.7 C) (Oral)   Resp 18   Ht 6' (1.829 m)   Wt 102.1 kg   SpO2 100%   BMI 30.52 kg/m   Physical Exam Vitals and nursing note reviewed.  Constitutional:      General: He is not in acute distress.    Appearance: He is well-developed. He is not diaphoretic.  HENT:     Head: Normocephalic and atraumatic.  Eyes:     General: No scleral icterus.       Right eye: No discharge.        Left eye: No discharge.     Conjunctiva/sclera: Conjunctivae normal.  Cardiovascular:     Rate and Rhythm: Normal  rate and regular rhythm.  Pulmonary:     Effort: Pulmonary effort is normal. No respiratory distress.     Breath sounds: No stridor.  Abdominal:     General: Abdomen is flat. There is no distension.     Palpations: Abdomen is soft.     Tenderness: There is abdominal tenderness in the right lower quadrant and periumbilical area. There is no guarding or rebound.     Hernia: No hernia is present.     Comments: Bowel sounds increased right lower quadrant, otherwise normal  Genitourinary:    Comments: Deferred Musculoskeletal:        General: No deformity.     Cervical back: Normal range of motion.  Skin:    General: Skin is warm and dry.  Neurological:     General: No focal deficit present.     Mental Status: He is alert.     Motor: No abnormal muscle tone.  Psychiatric:        Mood and Affect: Mood normal.        Behavior: Behavior normal.     ED Results / Procedures / Treatments   Labs (all labs ordered are listed, but only abnormal results are displayed) Labs Reviewed  COMPREHENSIVE METABOLIC PANEL - Abnormal; Notable for the following components:      Result Value   Glucose, Bld 108 (*)    All other components within normal limits  CBC - Abnormal; Notable for the following components:   WBC 13.0 (*)    All other components within normal limits  LIPASE, BLOOD  URINALYSIS, ROUTINE W REFLEX MICROSCOPIC    EKG EKG Interpretation  Date/Time:  Sunday December 14 2019 15:42:34 EDT Ventricular Rate:  88 PR Interval:  122 QRS Duration: 92 QT Interval:  368 QTC Calculation: 445 R Axis:   27 Text Interpretation: Normal sinus rhythm with sinus arrhythmia Inferior infarct , age undetermined Abnormal ECG t wave inversions slightly more pronounced Confirmed by Jacalyn Lefevre [phone]) on 12/14/2019 3:58:22 PM   Radiology CT Abdomen Pelvis W Contrast  Result Date: 12/14/2019 CLINICAL DATA:  Right lower quadrant pain. EXAM: CT ABDOMEN AND PELVIS WITH CONTRAST TECHNIQUE:  Multidetector CT imaging of the abdomen and pelvis was performed using the standard protocol following bolus administration of intravenous contrast. CONTRAST:  OMNIPAQUE IOHEXOL 300 MG/ML  SOLN COMPARISON:  None. FINDINGS: Lower chest: No acute abnormality. Hepatobiliary: No focal liver abnormality is seen. No gallstones, gallbladder wall thickening, or biliary dilatation. Pancreas: Unremarkable. No pancreatic ductal dilatation or surrounding inflammatory changes. Spleen: Normal in size without focal abnormality. Adrenals/Urinary Tract: Adrenal glands are unremarkable. Kidneys are normal in size, without renal calculi or hydronephrosis. A 1.1 cm diameter simple cyst is seen within the posterolateral aspect of the mid  right kidney. Bladder is unremarkable. Stomach/Bowel: Stomach is within normal limits. Appendix appears normal. No evidence of bowel dilatation. A markedly thickened and inflamed segment of proximal to mid sigmoid colon is seen. Numerous diverticula are also seen within this region of the large bowel. There is no evidence of free air. Vascular/Lymphatic: No significant vascular findings are present. No enlarged abdominal or pelvic lymph nodes. Reproductive: Prostate is unremarkable. Other: No abdominal wall hernia or abnormality. No abdominopelvic ascites. Musculoskeletal: No acute or significant osseous findings. IMPRESSION: 1. Markedly severity colitis and/or sigmoid diverticulitis. 2. Small right renal cyst. Electronically Signed   By: Aram Candelahaddeus  Houston M.D.   On: 12/14/2019 20:23    Procedures Procedures (including critical care time)  Medications Ordered in ED Medications  sodium chloride 0.9 % bolus 1,000 mL (0 mLs Intravenous Stopped 12/14/19 2036)  iohexol (OMNIPAQUE) 300 MG/ML solution 100 mL (100 mLs Intravenous Contrast Given 12/14/19 2011)  amoxicillin-clavulanate (AUGMENTIN) 875-125 MG per tablet 1 tablet (1 tablet Oral Given 12/14/19 2105)    ED Course  I have reviewed  the triage vital signs and the nursing notes.  Pertinent labs & imaging results that were available during my care of the patient were reviewed by me and considered in my medical decision making (see chart for details).    MDM Rules/Calculators/A&P                         Patient is a 34 year old man who presents today for evaluation of 1 day of abdominal pain.  The pain is waxing and waning however overall getting better.  On my initial exam he has tenderness primarily in the right lower quadrant with some referred periumbilical pain.  Labs show leukocytosis at 13.  CMP and lipase are unremarkable.   Here he is afebrile, not tachycardic or tachypneic, no evidence of SIRS/sepsis.  CT scan abdomen pelvis is obtained showing severe colitis/sigmoid diverticulitis. Based on his history of chronic constipation along with lack of bloody bowel movement or recent consumption of questionable food/water I suspect this is diverticulitis. He does have a slight leukocytosis however is young and generally well-appearing. We discussed disposition options and patient strongly wishes for discharge home. I feel this is appropriate. He is given a dose of Augmentin while in the emergency room and will be discharged with prescription for the same. He is also given prescription for pain medication and Zofran as needed. We discussed that there is a chance that patient's condition may worsen including perforation, abscess formation, sepsis or others. Patient states his understanding of return precautions.  Return precautions were discussed with patient who states their understanding.  At the time of discharge patient denied any unaddressed complaints or concerns.  Patient is agreeable for discharge home.  Note: Portions of this report may have been transcribed using voice recognition software. Every effort was made to ensure accuracy; however, inadvertent computerized transcription errors may be present  Final Clinical  Impression(s) / ED Diagnoses Final diagnoses:  Diverticulitis large intestine w/o perforation or abscess w/o bleeding  Generalized abdominal pain    Rx / DC Orders ED Discharge Orders         Ordered    amoxicillin-clavulanate (AUGMENTIN) 875-125 MG tablet  Every 12 hours        12/14/19 2055    HYDROcodone-acetaminophen (NORCO/VICODIN) 5-325 MG tablet  Every 6 hours PRN        12/14/19 2055    ondansetron (ZOFRAN ODT) 4 MG disintegrating  tablet  Every 8 hours PRN        12/14/19 2055           Norman Clay 12/14/19 2319    Jacalyn Lefevre, MD 12/15/19 0010

## 2019-12-14 NOTE — ED Triage Notes (Signed)
Low abdominal pain

## 2019-12-14 NOTE — Discharge Instructions (Signed)
Today your CT scan showed concern for diverticulitis.  This is when you get diverticulum which is a outpouching in the wall of the intestines that then become infected.  As we discussed today your CT scan showed a significant amount of inflammation.  You do not have a fever and your labs show a slight elevation in your white count which can indicate infection.  We discussed treatment options and disposition and you wished for trial of antibiotics at home.  We discussed the possibility that your condition can worsen and that if you develop fevers, worsening pain, uncontrolled vomiting, or have any new or concerning symptoms please do not hesitate to return to the emergency room for repeat evaluation.  You may have diarrhea from the antibiotics.  It is very important that you continue to take the antibiotics even if you get diarrhea unless a medical professional tells you that you may stop taking them.  If you stop too early the bacteria you are being treated for will become stronger and you may need different, more powerful antibiotics that have more side effects and worsening diarrhea.  Please stay well hydrated and consider probiotics as they may decrease the severity of your diarrhea.   You are being prescribed a medication which may make you sleepy. For 24 hours after one dose please do not drive, operate heavy machinery, care for a small child with out another adult present, or perform any activities that may cause harm to you or someone else if you were to fall asleep or be impaired.

## 2022-05-15 IMAGING — CT CT ABD-PELV W/ CM
2 of 4 series · 16 of 46 positions shown, 18 images · IV contrast (Omnipaque or Isovue)
Comparison: None.

CLINICAL DATA: Right lower quadrant pain.

EXAM:
CT ABDOMEN AND PELVIS WITH CONTRAST
TECHNIQUE: Multidetector CT imaging of the abdomen and pelvis was performed
using the standard protocol following bolus administration of
intravenous contrast.
CONTRAST:  100mL OMNIPAQUE IOHEXOL 300 MG/ML  SOLN

[Series 2: axial st · axial · 0.91mm/px · z∈[+848,+1343]mm · 13 of 109 slices shown, 15 images]
[im 5/109  soft-tissue]
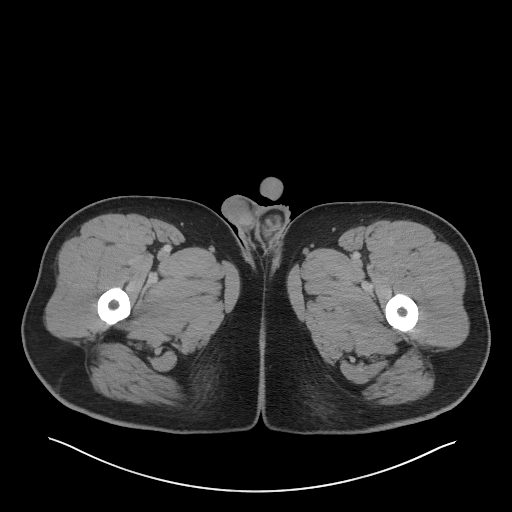
[im 5/109  bone]
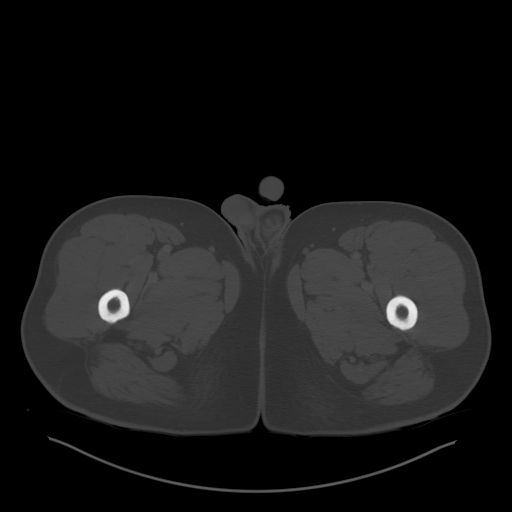
[im 15/109  soft-tissue]
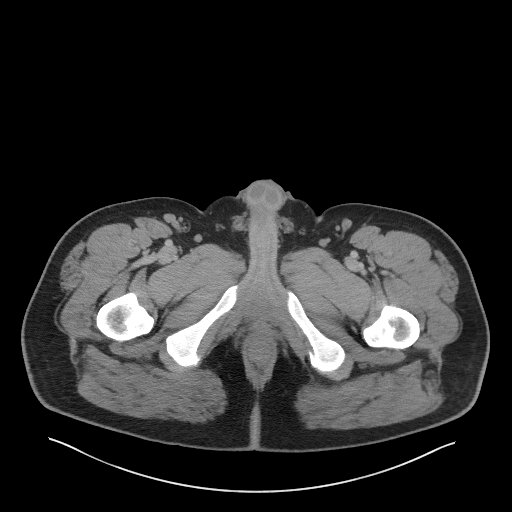
[im 24/109  soft-tissue]
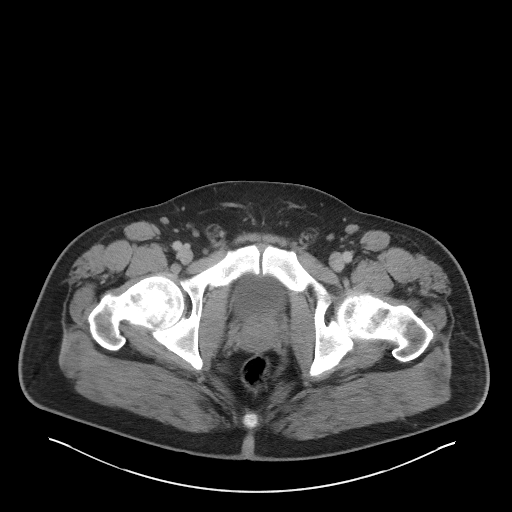
[im 29/109  soft-tissue]
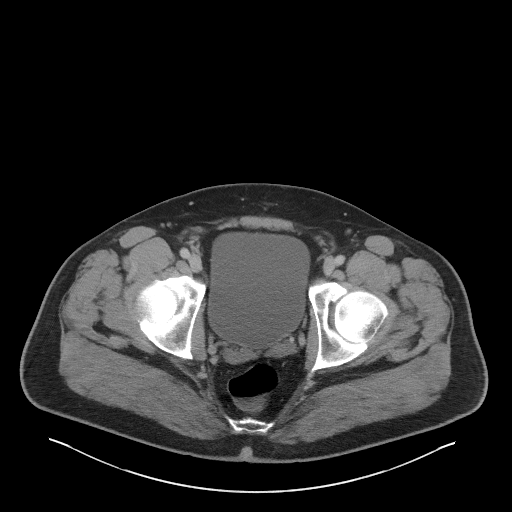
[im 38/109  soft-tissue]
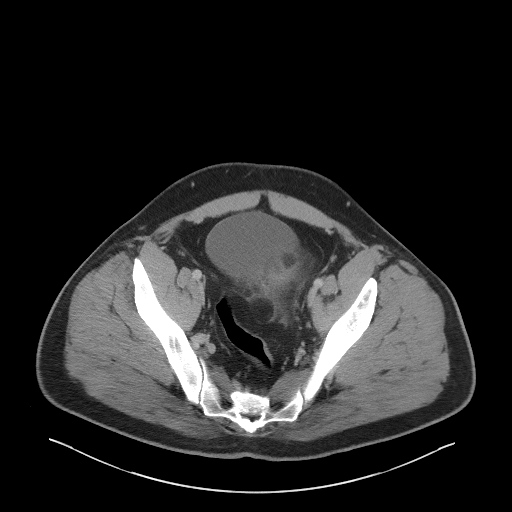
[im 47/109  soft-tissue]
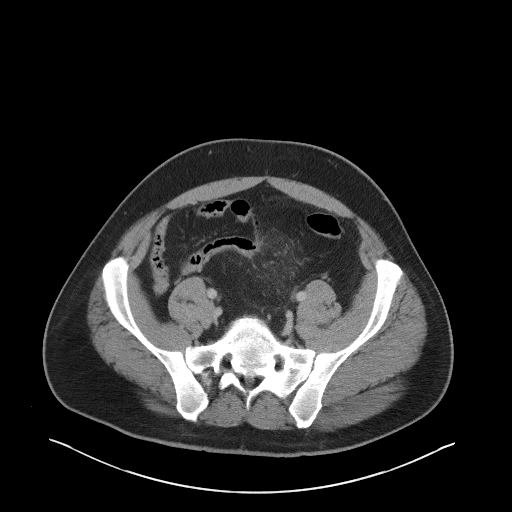
[im 57/109  soft-tissue]
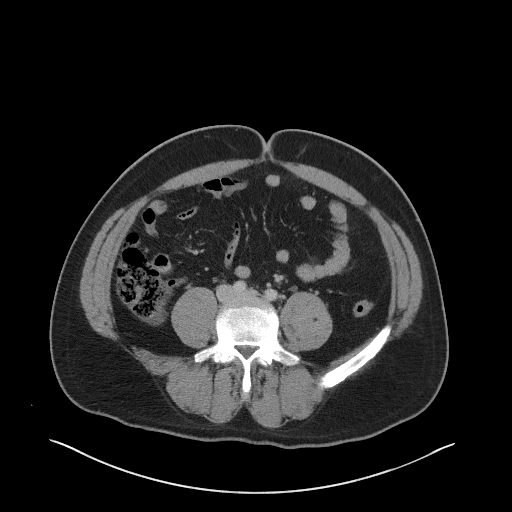
[im 62/109  soft-tissue]
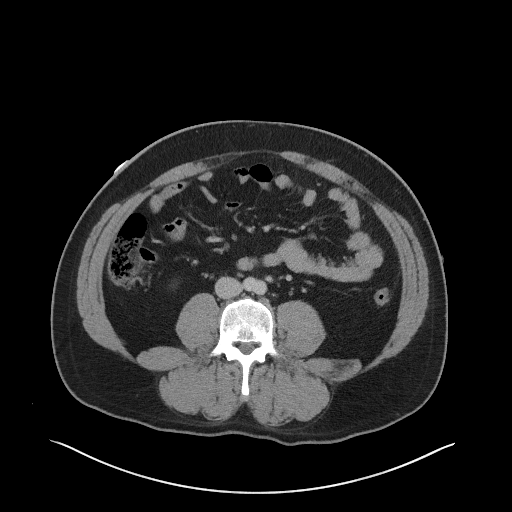
[im 71/109  soft-tissue]
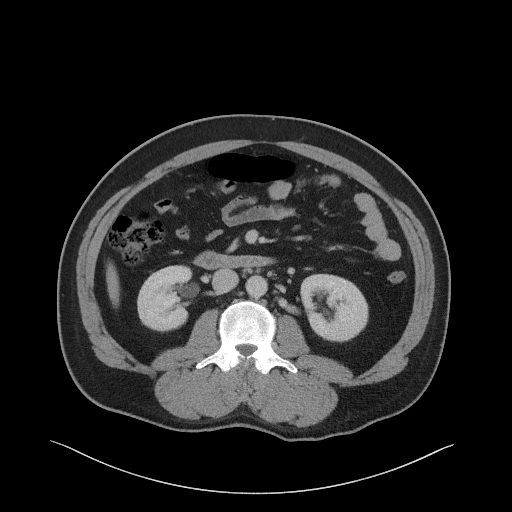
[im 71/109  bone]
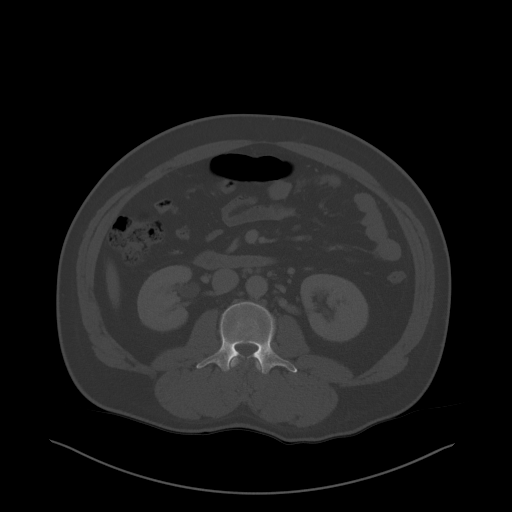
[im 80/109  soft-tissue]
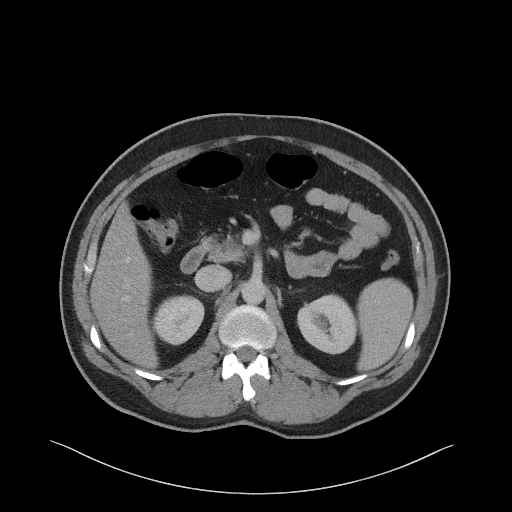
[im 85/109  soft-tissue]
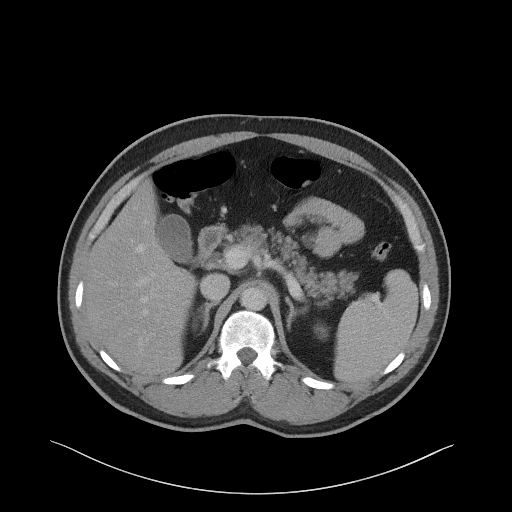
[im 94/109  soft-tissue]
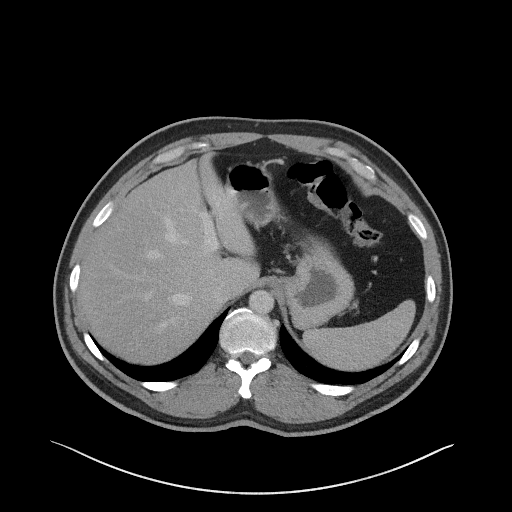
[im 104/109  soft-tissue]
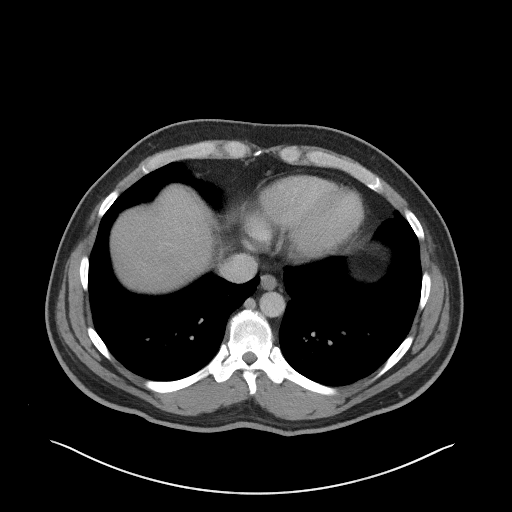

[Series 5: coronal st · coronal · 0.84mm/px · 3 of 113 slices shown]
[im 38/113  soft-tissue]
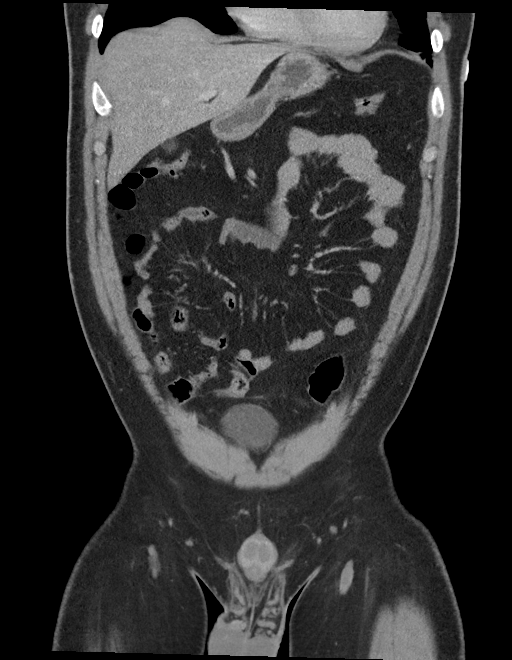
[im 50/113  soft-tissue]
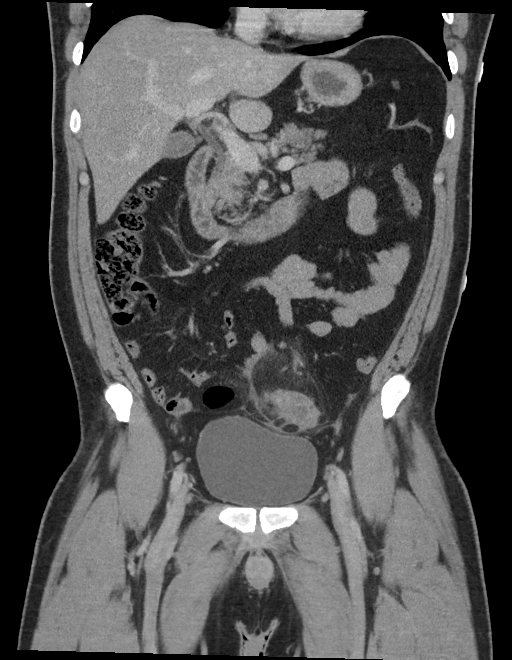
[im 63/113  soft-tissue]
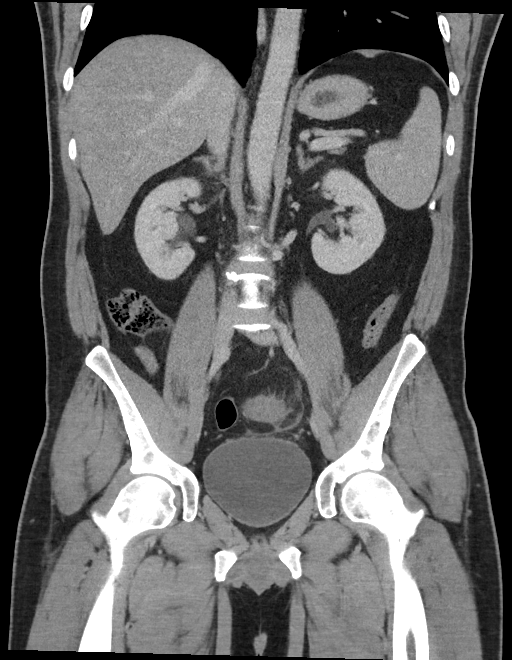

[16 of 46 positions shown; findings below may reference images not displayed]

FINDINGS: Lower chest: No acute abnormality.

Hepatobiliary: No focal liver abnormality is seen. No gallstones,
gallbladder wall thickening, or biliary dilatation.

Pancreas: Unremarkable. No pancreatic ductal dilatation or
surrounding inflammatory changes.

Spleen: Normal in size without focal abnormality.

Adrenals/Urinary Tract: Adrenal glands are unremarkable. Kidneys are
normal in size, without renal calculi or hydronephrosis. A 1.1 cm
diameter simple cyst is seen within the posterolateral aspect of the
mid right kidney. Bladder is unremarkable.

Stomach/Bowel: Stomach is within normal limits. Appendix appears
normal. No evidence of bowel dilatation. A markedly thickened and
inflamed segment of proximal to mid sigmoid colon is seen. Numerous
diverticula are also seen within this region of the large bowel.
There is no evidence of free air.

Vascular/Lymphatic: No significant vascular findings are present. No
enlarged abdominal or pelvic lymph nodes.

Reproductive: Prostate is unremarkable.

Other: No abdominal wall hernia or abnormality. No abdominopelvic
ascites.

Musculoskeletal: No acute or significant osseous findings.
IMPRESSION: 1. Markedly severity colitis and/or sigmoid diverticulitis.
2. Small right renal cyst.

## 2023-12-12 ENCOUNTER — Other Ambulatory Visit: Payer: Self-pay

## 2023-12-12 ENCOUNTER — Encounter (HOSPITAL_BASED_OUTPATIENT_CLINIC_OR_DEPARTMENT_OTHER): Payer: Self-pay | Admitting: Emergency Medicine

## 2023-12-12 ENCOUNTER — Emergency Department (HOSPITAL_BASED_OUTPATIENT_CLINIC_OR_DEPARTMENT_OTHER)
Admission: EM | Admit: 2023-12-12 | Discharge: 2023-12-12 | Disposition: A | Discharge status: Home / Self Care | Payer: Self-pay | Attending: Emergency Medicine | Admitting: Emergency Medicine

## 2023-12-12 ENCOUNTER — Emergency Department (HOSPITAL_BASED_OUTPATIENT_CLINIC_OR_DEPARTMENT_OTHER): Payer: Self-pay | Admitting: Radiology

## 2023-12-12 DIAGNOSIS — Z87891 Personal history of nicotine dependence: Secondary | ICD-10-CM | POA: Insufficient documentation

## 2023-12-12 DIAGNOSIS — J45909 Unspecified asthma, uncomplicated: Secondary | ICD-10-CM | POA: Insufficient documentation

## 2023-12-12 DIAGNOSIS — F419 Anxiety disorder, unspecified: Secondary | ICD-10-CM | POA: Insufficient documentation

## 2023-12-12 LAB — BASIC METABOLIC PANEL WITH GFR
Anion gap: 12 (ref 5–15)
BUN: 12 mg/dL (ref 6–20)
CO2: 26 mmol/L (ref 22–32)
Calcium: 10.1 mg/dL (ref 8.9–10.3)
Chloride: 102 mmol/L (ref 98–111)
Creatinine, Ser: 1.13 mg/dL (ref 0.61–1.24)
GFR, Estimated: >60 mL/min (ref >60)
Glucose, Bld: 111 mg/dL — ABNORMAL HIGH (ref 70–99)
Potassium: 3.9 mmol/L (ref 3.5–5.1)
Sodium: 140 mmol/L (ref 135–145)

## 2023-12-12 LAB — CBC
HCT: 47.7 % (ref 39.0–52.0)
Hemoglobin: 16.5 g/dL (ref 13.0–17.0)
MCH: 30 pg (ref 26.0–34.0)
MCHC: 34.6 g/dL (ref 30.0–36.0)
MCV: 86.7 fL (ref 80.0–100.0)
Platelets: 229 K/uL (ref 150–400)
RBC: 5.5 MIL/uL (ref 4.22–5.81)
RDW: 12.8 % (ref 11.5–15.5)
WBC: 8.2 K/uL (ref 4.0–10.5)
nRBC: 0 % (ref 0.0–0.2)

## 2023-12-12 LAB — TROPONIN T, HIGH SENSITIVITY: Troponin T High Sensitivity: <15 ng/L (ref 0–19)

## 2023-12-12 NOTE — ED Provider Notes (Signed)
 [place] EMERGENCY DEPARTMENT AT [facility] Provider Note   CSN: [id number] Arrival date & time: 12/12/23  9057     Patient presents with: Anxiety and Chest Pain   James Hendricks is a 38 y.o. male.  With a remote history of asthma who presents to the ED for anxiety.  For the last 5 days the patient has felt on edge.  He had a difficult conversation that opened up a lot of old wounds 5 days ago and since then has had overwhelming anxiety.  Suspects he may have had a panic attack today.  No prior history of acute anxiety attacks.  Denies HI SI.  Former smoker but quit 8 years ago.  No other drug or alcohol use.  On my assessment denies chest pain nausea vomiting shortness of breath recent illness.  Cites previous loss of 2 children and financial stressors as triggers    Anxiety Associated symptoms include chest pain.  Chest Pain Associated symptoms: anxiety        Prior to Admission medications   Medication Sig Start Date End Date Taking? Authorizing Provider  amoxicillin -clavulanate (AUGMENTIN ) 875-125 MG tablet Take 1 tablet by mouth every 12 (twelve) hours. 12/14/19   Windle Almarie ORN, PA-C  HYDROcodone -acetaminophen  (NORCO/VICODIN) 5-325 MG tablet Take 1 tablet by mouth every 6 (six) hours as needed for severe pain. 12/14/19   Windle Almarie ORN, PA-C  ibuprofen  (ADVIL ,MOTRIN ) 200 MG tablet Take 800 mg by mouth every 6 (six) hours as needed for pain or headache.     [provider]  ondansetron  (ZOFRAN  ODT) 4 MG disintegrating tablet Take 1 tablet (4 mg total) by mouth every 8 (eight) hours as needed for nausea or vomiting. 12/14/19   Windle Almarie ORN, PA-C    Allergies: Shellfish allergy    Review of Systems  Cardiovascular:  Positive for chest pain.    Updated Vital Signs BP (!) 134/93 (BP Location: Right Arm)   Pulse 92   Temp 98.1 F (36.7 C)   Resp 18   Wt 102.1 kg   SpO2 99%   BMI 30.52 kg/m   Physical Exam Vitals and  nursing note reviewed.  HENT:     Head: Normocephalic and atraumatic.  Eyes:     Pupils: Pupils are equal, round, and reactive to light.  Cardiovascular:     Rate and Rhythm: Normal rate and regular rhythm.  Pulmonary:     Effort: Pulmonary effort is normal.     Breath sounds: Normal breath sounds.  Abdominal:     Palpations: Abdomen is soft.     Tenderness: There is no abdominal tenderness.  Skin:    General: Skin is warm and dry.  Neurological:     Mental Status: He is alert.  Psychiatric:        Mood and Affect: Mood normal.     (all labs ordered are listed, but only abnormal results are displayed) Labs Reviewed  BASIC METABOLIC PANEL WITH GFR - Abnormal; Notable for the following components:      Result Value   Glucose, Bld 111 (*)    All other components within normal limits  CBC  TROPONIN T, HIGH SENSITIVITY    EKG: EKG Interpretation Date/Time:  Wednesday December 12 2023 09:51:31 EDT Ventricular Rate:  95 PR Interval:  112 QRS Duration:  92 QT Interval:  358 QTC Calculation: 449 R Axis:   34  Text Interpretation: Normal sinus rhythm Normal ECG When compared with ECG of 14-Dec-2019 15:42, Criteria  for Inferior infarct are no longer Present T wave inversion less evident in Inferior leads Nonspecific T wave abnormality no longer evident in Anterolateral leads Confirmed by Pamella Sharper [phone]) on 12/12/2023 12:03:03 PM  Radiology: No results found.   Procedures   Medications Ordered in the ED - No data to display                                  Medical Decision Making 38 year old male presenting for acute anxiety attack.  Increase anxiety over 5 days with panic attack today.  Reported some chest discomfort earlier ACS workup negative.  Chest x-ray clear.  Laboratory workup unremarkable.  Denies HI SI.  Is calm and cooperative on my assessment.  Multiple life stressors.  Will discharge with instruction for establishing outpatient mental health follow-up.   Return precautions overly worrisome for severe anxiety SI HI were discussed with the patient and his mother in detail  Amount and/or Complexity of Data Reviewed Labs: ordered.        Final diagnoses:  Acute anxiety    ED Discharge Orders     None          Pamella Sharper LABOR, DO 12/12/23 1257

## 2023-12-12 NOTE — ED Triage Notes (Signed)
 Pt endorses overwhelming feeling of fear x 5 days. Reports heartburn for several days in center of chest.

## 2023-12-12 NOTE — Discharge Instructions (Addendum)
 You were seen in the emerged ferment for a reported anxiety attack Your blood work and EKG all looked okay You need to follow-up with [facility] here in [place] to establish care with an outpatient mental health provider to discuss your ongoing anxiety and panic attack Return to the emergency room for severe anxiety chest pain trouble breathing or any other concerns Also return if you have thoughts of harming yourself or others

## 2024-01-01 ENCOUNTER — Ambulatory Visit: Payer: Self-pay

## 2024-01-01 NOTE — Telephone Encounter (Signed)
 FYI Only or Action Required?: FYI only for provider: appointment scheduled on 01/08/24.  Patient was last seen in primary care on calling to establish care.  Called Nurse Triage reporting Anxiety.  Symptoms began several years ago, exacerbated within past few weeks.  Interventions attempted: Other: ED visit on 12/12/23.  Symptoms are: gradually worsening.  Triage Disposition: See Within 2 Weeks in Office (overriding See PCP When Office is Open (Within 3 Days))  Patient/caregiver understands and will follow disposition?: Yes                            Copied from CRM [id number]. Topic: Clinical - Red Word Triage >> Jan 01, 2024 11:06 AM Selinda RAMAN wrote: Red Word that prompted transfer to Nurse Triage: Jaylene the mother called in stating the patient has been dealing with some anxiety and depression in the last couple of weeks. Suzen his wife then got on the phone and confirmed that as well as they are very concerned. I will transfer to E2C2 NT  Reason for Disposition  MODERATE anxiety (e.g., persistent or frequent anxiety symptoms; interferes with sleep, school, or work)  Answer Assessment - Initial Assessment Questions 1. CONCERN: Did anything happen that prompted you to call today?      Worsening symptoms of anxiety  2. ANXIETY SYMPTOMS: Can you describe how you (your loved one; patient) have been feeling? (e.g., tense, restless, panicky, anxious, keyed up, overwhelmed, sense of impending doom).      States he is scared, patient appears paranoid to this RN, refers to anxiety as what if anxiety, states he is scared to die, states I feel trapped inside my head 3. ONSET: How long have you been feeling this way? (e.g., hours, days, weeks)     Worsening within past 3 weeks (ED visit on 12/12/23) 4. SEVERITY: How would you rate the level of anxiety? (e.g., 0 - 10; or mild, moderate, severe).     Moderate 5. FUNCTIONAL IMPAIRMENT: How have these  feelings affected your ability to do daily activities? Have you had more difficulty than usual doing your normal daily activities? (e.g., getting better, same, worse; self-care, school, work, interactions)     States eating has been difficult, difficulty sleeping 6. HISTORY: Have you felt this way before? Have you ever been diagnosed with an anxiety problem in the past? (e.g., generalized anxiety disorder, panic attacks, PTSD). If Yes, ask: How was this problem treated? (e.g., medicines, counseling, etc.)     Yes, states anxiety started when he became a dad 13 years ago and has worsened 7. RISK OF HARM - SUICIDAL IDEATION: Do you ever have thoughts of hurting or killing yourself? If Yes, ask:  Do you have these feelings now? Do you have a plan on how you would do this?     Denies 8. TREATMENT:  What has been done so far to treat this anxiety? (e.g., medicines, relaxation strategies). What has helped?     Walking, breathing techniques  9. THERAPIST: Do you have a counselor or therapist? If Yes, ask: What is their name?     Denies 10. POTENTIAL TRIGGERS: Do you drink caffeinated beverages (e.g., coffee, colas, teas), and how much daily? Do you drink alcohol or use any drugs? Have you started any new medicines recently?     Daughter recently got her first boyfriend, financial stressors 11. PATIENT SUPPORT: Who is with you now? Who do you live with? Do you have family or  friends who you can talk to?      Patient lives with wife and kids, states his work has been very supportive 12. OTHER SYMPTOMS: Do you have any other symptoms? (e.g., feeling depressed, trouble concentrating, trouble sleeping, trouble breathing, palpitations or fast heartbeat, chest pain, sweating, nausea, or diarrhea)     Lack of appetite and upset stomach, tension headaches, pain on left side, denies difficulty breathing 13. PREGNANCY: Is there any chance you are pregnant? When was your last  menstrual period?     N/A    Patient went to ED for symptoms on 12/12/23.  Protocols used: Anxiety and Panic Attack-A-AH

## 2024-01-08 ENCOUNTER — Ambulatory Visit (INDEPENDENT_AMBULATORY_CARE_PROVIDER_SITE_OTHER): Payer: Self-pay | Admitting: Family Medicine

## 2024-01-08 ENCOUNTER — Encounter: Payer: Self-pay | Admitting: Family Medicine

## 2024-01-08 VITALS — BP 121/81 | HR 87 | Temp 97.1°F | Ht 73.0 in | Wt 222.4 lb

## 2024-01-08 DIAGNOSIS — F411 Generalized anxiety disorder: Secondary | ICD-10-CM

## 2024-01-08 DIAGNOSIS — R7309 Other abnormal glucose: Secondary | ICD-10-CM

## 2024-01-08 DIAGNOSIS — Z6829 Body mass index (BMI) 29.0-29.9, adult: Secondary | ICD-10-CM

## 2024-01-08 DIAGNOSIS — F5104 Psychophysiologic insomnia: Secondary | ICD-10-CM

## 2024-01-08 LAB — BAYER DCA HB A1C WAIVED: HB A1C (BAYER DCA - WAIVED): 5.2 % (ref 4.8–5.6)

## 2024-01-08 MED ORDER — HYDROXYZINE HCL 10 MG PO TABS: 10.0000 mg | ORAL_TABLET | Freq: Three times a day (TID) | ORAL | 3 refills | Status: AC | PRN

## 2024-01-08 MED ORDER — SERTRALINE HCL 50 MG PO TABS: 50.0000 mg | ORAL_TABLET | Freq: Every day | ORAL | 0 refills | Status: DC

## 2024-01-08 NOTE — Patient Instructions (Signed)
Your provider wants you to schedule an appointment with a Psychologist/Psychiatrist. The following list of offices requires the patient to call and make their own appointment, as there is information they need that only you can provide. Please feel free to choose form the following providers:  [facility]   [phone] Crisis Recovery in [place] [phone]  [facility]  [phone]   [address] [place], [place]  (Scheduled through [facility]) Must call and do an interview for appointment. Sees Children / Accepts Medicaid  [facility]    [phone]  [address], [address]    [place], [place]       [facility]  [phone] [address] [place], [place]  Evaluates for Autism but does not treat it Sees Children / Accepts Medicaid  [facility]    [phone] [address], [address]   [place], [place] Medication management, substance abuse, bipolar, grief, family, marriage, OCD, anxiety, PTSD Sees children / Accepts Medicaid  [facility]    [phone] [address] [place], [place] Sees children / Accepts Medicaid  [facility]  [phone] [address] [place], [place]   Dr Akinlayo     [phone] [address] [place], [place]  Sees ADD & ADHD for treatment Accepts Medicaid  [facility]  [phone] [address] [place], [place] Evaluates for Autism Accepts Medicaid  [facility]  [phone] [address] [place], [place]  Does Adult ADD evaluations Does not accept Medicaid  [facility]   [phone] [address]   , [place] Uses animal therapy  Sees children as young as 3 years old Accepts Medicaid  [facility]     [phone]    [address]  [place],  [address] Sees children Accepts Medicaid  

## 2024-01-08 NOTE — Progress Notes (Signed)
 Subjective:  Patient ID: James Hendricks, male    DOB: 19-Jan-1986, 38 y.o.   MRN: [id number]  Patient Care Team: Severa Rock CHRISTELLA, FNP as PCP - General (Family Medicine)   Chief Complaint:  New Patient (Initial Visit) (No previous PCP) and ER follow up  (12/12/2023 (2 hours)/[facility]- anxiety )   HPI: James Hendricks is a 38 y.o. male presenting on 01/08/2024 for New Patient (Initial Visit) (No previous PCP) and ER follow up  (12/12/2023 (2 hours)/[facility] at [facility]- anxiety )   James Hendricks is a 38 year old male who presents toady to establish care with PCP and for evaluation of anxiety. He has not had a PCP in over 25 years.   Anxiety and panic symptoms - Anxiety present for almost one month, with no prior significant history of anxiety. - Triggers include stressors related to children and family, and concerns about the impact of his potential death on his family. - Symptoms include overthinking, insomnia, loss of appetite, restless legs, and inability to get comfortable. - Experienced one full-blown panic attack, resulting in an emergency room visit. - Nausea and vomiting occur during anxious episodes, especially when attempting to eat. - A specific episode of fear occurred while lying on the couch on a Sunday, with persistent symptoms since then. - Conversation with a friend about feeling overworked and underpaid may have contributed to anxiety. - No known family history of anxiety or depression, though mother experiences fidgetiness managed without medication.  Lifestyle changes and substance use - Quit smoking eight years ago. - Stopped vaping five months ago. - Stopped consuming caffeine, including coffee and [facility], due to exacerbation of anxiety. - No recent changes in over-the-counter medications that could have contributed to anxiety. - Occasionally takes Tylenol  and ibuprofen  for  headaches attributed to tension.          Relevant past medical, surgical, family, and social history reviewed and updated as indicated.  Allergies and medications reviewed and updated. Data reviewed: Chart in Epic.   Past Medical History:  Diagnosis Date   Asthma     Past Surgical History:  Procedure Laterality Date   FRACTURE SURGERY      Social History   Socioeconomic History   Marital status: Married    Spouse name: Not on file   Number of children: Not on file   Years of education: Not on file   Highest education level: Not on file  Occupational History   Not on file  Tobacco Use   Smoking status: Former    Current packs/day: 1.00    Average packs/day: 1 pack/day for 8.9 years (8.9 ttl pk-yrs)    Types: Cigarettes    Start date: 2017    Quit date: 1997   Smokeless tobacco: Current  Vaping Use   Vaping status: Former   Quit date: 08/08/2023  Substance and Sexual Activity   Alcohol use: No   Drug use: Not Currently   Sexual activity: Yes  Other Topics Concern   Not on file  Social History Narrative   Not on file   Social Drivers of Health   Financial Resource Strain: Not on file  Food Insecurity: Not on file  Transportation Needs: Not on file  Physical Activity: Not on file  Stress: Not on file  Social Connections: Not on file  Intimate Partner Violence: Not on file    Outpatient Encounter Medications as of  01/08/2024  Medication Sig   hydrOXYzine (ATARAX) 10 MG tablet Take 1 tablet (10 mg total) by mouth 3 (three) times daily as needed for anxiety.   sertraline (ZOLOFT) 50 MG tablet Take 1 tablet (50 mg total) by mouth daily.   [DISCONTINUED] amoxicillin -clavulanate (AUGMENTIN ) 875-125 MG tablet Take 1 tablet by mouth every 12 (twelve) hours.   [DISCONTINUED] HYDROcodone -acetaminophen  (NORCO/VICODIN) 5-325 MG tablet Take 1 tablet by mouth every 6 (six) hours as needed for severe pain.   [DISCONTINUED] ibuprofen  (ADVIL ,MOTRIN ) 200 MG tablet Take  800 mg by mouth every 6 (six) hours as needed for pain or headache.    [DISCONTINUED] ondansetron  (ZOFRAN  ODT) 4 MG disintegrating tablet Take 1 tablet (4 mg total) by mouth every 8 (eight) hours as needed for nausea or vomiting.   No facility-administered encounter medications on file as of 01/08/2024.    Allergies  Allergen Reactions   Shellfish Allergy     Swelling,     Pertinent ROS per HPI, otherwise unremarkable      Objective:  BP 121/81   Pulse 87   Temp (!) 97.1 F (36.2 C)   Ht 6' 1 (1.854 m)   Wt 222 lb 6.4 oz (100.9 kg)   SpO2 95%   BMI 29.34 kg/m    Wt Readings from Last 3 Encounters:  01/08/24 222 lb 6.4 oz (100.9 kg)  12/12/23 225 lb (102.1 kg)  12/14/19 225 lb (102.1 kg)    Physical Exam Vitals and nursing note reviewed.  Constitutional:      General: He is not in acute distress.    Appearance: Normal appearance. He is well-developed, well-groomed and overweight. He is not ill-appearing, toxic-appearing or diaphoretic.  HENT:     Head: Normocephalic and atraumatic.     Jaw: There is normal jaw occlusion.     Right Ear: Hearing, tympanic membrane, ear canal and external ear normal.     Left Ear: Hearing, tympanic membrane, ear canal and external ear normal.     Nose: Nose normal.     Mouth/Throat:     Lips: Pink.     Mouth: Mucous membranes are moist.     Pharynx: Oropharynx is clear. Uvula midline.  Eyes:     General: Lids are normal.     Extraocular Movements: Extraocular movements intact.     Conjunctiva/sclera: Conjunctivae normal.     Pupils: Pupils are equal, round, and reactive to light.  Neck:     Thyroid: No thyroid mass, thyromegaly or thyroid tenderness.     Vascular: No carotid bruit or JVD.     Trachea: Trachea and phonation normal.  Cardiovascular:     Rate and Rhythm: Normal rate and regular rhythm.     Chest Wall: PMI is not displaced.     Pulses:          Dorsalis pedis pulses are 2+ on the right side and 2+ on the left  side.       Posterior tibial pulses are 2+ on the right side and 2+ on the left side.     Heart sounds: Normal heart sounds. No murmur heard.    No friction rub. No gallop.  Pulmonary:     Effort: Pulmonary effort is normal. No respiratory distress.     Breath sounds: Normal breath sounds. No wheezing.  Chest:     Chest wall: No mass.  Breasts:    Breasts are symmetrical.  Abdominal:     General: Bowel sounds are normal.  Palpations: Abdomen is soft.  Musculoskeletal:        General: Normal range of motion.     Cervical back: Full passive range of motion without pain, normal range of motion and neck supple.     Right lower leg: No edema.     Left lower leg: No edema.  Feet:     Right foot:     Skin integrity: Skin integrity normal.     Toenail Condition: Right toenails are normal.     Left foot:     Skin integrity: Skin integrity normal.     Toenail Condition: Left toenails are normal.  Lymphadenopathy:     Cervical: No cervical adenopathy.     Upper Body:     Right upper body: No supraclavicular, axillary or pectoral adenopathy.     Left upper body: No supraclavicular, axillary or pectoral adenopathy.  Skin:    General: Skin is warm and dry.     Capillary Refill: Capillary refill takes less than 2 seconds.     Coloration: Skin is not cyanotic, jaundiced or pale.     Findings: No rash.  Neurological:     General: No focal deficit present.     Mental Status: He is alert and oriented to person, place, and time.     Sensory: Sensation is intact.     Motor: Motor function is intact.     Coordination: Coordination is intact.     Gait: Gait is intact.     Deep Tendon Reflexes: Reflexes are normal and symmetric.  Psychiatric:        Attention and Perception: Attention and perception normal.        Mood and Affect: Affect normal. Mood is anxious.        Speech: Speech normal.        Behavior: Behavior normal. Behavior is cooperative.        Thought Content: Thought content  normal.        Cognition and Memory: Cognition and memory normal.        Judgment: Judgment normal.     Comments: Fidgety       Results for orders placed or performed during the hospital encounter of 12/12/23  Basic metabolic panel   Collection Time: 12/12/23 10:04 AM  Result Value Ref Range   Sodium 140 135 - 145 mmol/L   Potassium 3.9 3.5 - 5.1 mmol/L   Chloride 102 98 - 111 mmol/L   CO2 26 22 - 32 mmol/L   Glucose, Bld 111 (H) 70 - 99 mg/dL   BUN 12 6 - 20 mg/dL   Creatinine, Ser 8.86 0.61 - 1.24 mg/dL   Calcium 89.8 8.9 - 89.6 mg/dL   GFR, Estimated >39 >39 mL/min   Anion gap 12 5 - 15  CBC   Collection Time: 12/12/23 10:04 AM  Result Value Ref Range   WBC 8.2 4.0 - 10.5 K/uL   RBC 5.50 4.22 - 5.81 MIL/uL   Hemoglobin 16.5 13.0 - 17.0 g/dL   HCT 52.2 60.9 - 47.9 %   MCV 86.7 80.0 - 100.0 fL   MCH 30.0 26.0 - 34.0 pg   MCHC 34.6 30.0 - 36.0 g/dL   RDW 87.1 88.4 - 84.4 %   Platelets 229 150 - 400 K/uL   nRBC 0.0 0.0 - 0.2 %  Troponin T, High Sensitivity   Collection Time: 12/12/23 10:04 AM  Result Value Ref Range   Troponin T High Sensitivity <15 0 - 19 ng/L  Pertinent labs & imaging results that were available during my care of the patient were reviewed by me and considered in my medical decision making.  Assessment & Plan:  Iven was seen today for new patient (initial visit) and er follow up .  Diagnoses and all orders for this visit:  GAD (generalized anxiety disorder) -     Anemia Profile B -     CMP14+EGFR -     TSH -     VITAMIN D 25 Hydroxy (Vit-D Deficiency, Fractures) -     T4, Free -     sertraline (ZOLOFT) 50 MG tablet; Take 1 tablet (50 mg total) by mouth daily. -     hydrOXYzine (ATARAX) 10 MG tablet; Take 1 tablet (10 mg total) by mouth 3 (three) times daily as needed for anxiety.  Psychophysiological insomnia -     Anemia Profile B -     CMP14+EGFR -     TSH -     VITAMIN D 25 Hydroxy (Vit-D Deficiency, Fractures) -     T4,  Free -     sertraline (ZOLOFT) 50 MG tablet; Take 1 tablet (50 mg total) by mouth daily. -     hydrOXYzine (ATARAX) 10 MG tablet; Take 1 tablet (10 mg total) by mouth 3 (three) times daily as needed for anxiety.  Elevated glucose -     CMP14+EGFR -     Bayer DCA Hb A1c Waived  BMI 29.0-29.9,adult -     Anemia Profile B -     CMP14+EGFR -     TSH -     VITAMIN D 25 Hydroxy (Vit-D Deficiency, Fractures) -     T4, Free -     Bayer DCA Hb A1c Waived      Generalized anxiety disorder with associated insomnia Generalized anxiety disorder with associated insomnia, onset approximately one month ago. Symptoms include overthinking, insomnia, anorexia, restless legs, and tension headaches. Anxiety exacerbated by concerns about family and work stressors. No significant family history of anxiety or depression. No recent medication changes. Differential diagnosis includes thyroid dysfunction. Anxiety likely contributing to tension headaches and insomnia. Discussed the chronic nature of anxiety and the need for treatment similar to chronic conditions like diabetes or hypertension. Emphasized the importance of patience and time for medication efficacy. Discussed potential side effects of sertraline, including headaches, GI upset, drowsiness, and decreased libido at higher doses. Atarax prescribed for acute anxiety episodes, non-addictive and can be taken up to three times daily as needed. - Prescribed sertraline (Zoloft) 50 mg, instructed to take half a tablet daily for the first week, then increase to a full tablet if no significant side effects. - Prescribed Atarax for acute anxiety episodes, up to three times daily as needed. - Ordered thyroid function tests and other labs to rule out underlying conditions. - Provided counseling resources for additional support. - Scheduled follow-up appointment in six weeks to assess medication efficacy and side effects.          Continue all other maintenance  medications.  Follow up plan: Return in 6 weeks (on 02/19/2024) for Anxiety.   Continue healthy lifestyle choices, including diet (rich in fruits, vegetables, and lean proteins, and low in salt and simple carbohydrates) and exercise (at least 30 minutes of moderate physical activity daily).  Educational handout given for GAD, sertraline, BH resources in community   The above assessment and management plan was discussed with the patient. The patient verbalized understanding of and has agreed to the  management plan. Patient is aware to call the clinic if they develop any new symptoms or if symptoms persist or worsen. Patient is aware when to return to the clinic for a follow-up visit. Patient educated on when it is appropriate to go to the emergency department.   Rosaline Bruns, FNP-C [facility] [phone]

## 2024-01-09 ENCOUNTER — Ambulatory Visit: Payer: Self-pay | Admitting: Family Medicine

## 2024-01-09 DIAGNOSIS — R7689 Other specified abnormal immunological findings in serum: Secondary | ICD-10-CM

## 2024-01-09 DIAGNOSIS — R7989 Other specified abnormal findings of blood chemistry: Secondary | ICD-10-CM

## 2024-01-09 LAB — CMP14+EGFR
ALT: 36 IU/L (ref 0–44)
AST: 18 IU/L (ref 0–40)
Albumin: 4.7 g/dL (ref 4.1–5.1)
Alkaline Phosphatase: 67 IU/L (ref 47–123)
BUN/Creatinine Ratio: 16 (ref 9–20)
BUN: 18 mg/dL (ref 6–20)
Bilirubin Total: 0.6 mg/dL (ref 0.0–1.2)
CO2: 24 mmol/L (ref 20–29)
Calcium: 9.4 mg/dL (ref 8.7–10.2)
Chloride: 102 mmol/L (ref 96–106)
Creatinine, Ser: 1.14 mg/dL (ref 0.76–1.27)
Globulin, Total: 2.8 g/dL (ref 1.5–4.5)
Glucose: 93 mg/dL (ref 70–99)
Potassium: 4.2 mmol/L (ref 3.5–5.2)
Sodium: 141 mmol/L (ref 134–144)
Total Protein: 7.5 g/dL (ref 6.0–8.5)
eGFR: 84 mL/min/1.73 (ref >59)

## 2024-01-09 LAB — ANEMIA PROFILE B
Basophils Absolute: 0 x10E3/uL (ref 0.0–0.2)
Basos: 1 %
EOS (ABSOLUTE): 0.1 x10E3/uL (ref 0.0–0.4)
Eos: 1 %
Ferritin: 161 ng/mL (ref 30–400)
Folate: 14.2 ng/mL (ref >3.0)
Hematocrit: 47.2 % (ref 37.5–51.0)
Hemoglobin: 15.8 g/dL (ref 13.0–17.7)
Immature Grans (Abs): 0 x10E3/uL (ref 0.0–0.1)
Immature Granulocytes: 0 %
Iron Saturation: 32 % (ref 15–55)
Iron: 111 ug/dL (ref 38–169)
Lymphocytes Absolute: 1.3 x10E3/uL (ref 0.7–3.1)
Lymphs: 22 %
MCH: 30 pg (ref 26.6–33.0)
MCHC: 33.5 g/dL (ref 31.5–35.7)
MCV: 90 fL (ref 79–97)
Monocytes Absolute: 0.4 x10E3/uL (ref 0.1–0.9)
Monocytes: 7 %
Neutrophils Absolute: 4.1 x10E3/uL (ref 1.4–7.0)
Neutrophils: 69 %
Platelets: 213 x10E3/uL (ref 150–450)
RBC: 5.26 x10E6/uL (ref 4.14–5.80)
RDW: 12.7 % (ref 11.6–15.4)
Retic Ct Pct: 1.1 % (ref 0.6–2.6)
Total Iron Binding Capacity: 348 ug/dL (ref 250–450)
UIBC: 237 ug/dL (ref 111–343)
Vitamin B-12: 531 pg/mL (ref 232–1245)
WBC: 5.9 x10E3/uL (ref 3.4–10.8)

## 2024-01-09 LAB — T4, FREE: Free T4: 1.68 ng/dL (ref 0.82–1.77)

## 2024-01-09 LAB — TSH: TSH: 0.306 u[IU]/mL — AB (ref 0.450–4.500)

## 2024-01-09 LAB — VITAMIN D 25 HYDROXY (VIT D DEFICIENCY, FRACTURES): Vit D, 25-Hydroxy: 28.8 ng/mL — AB (ref 30.0–100.0)

## 2024-01-16 DIAGNOSIS — R7989 Other specified abnormal findings of blood chemistry: Secondary | ICD-10-CM | POA: Insufficient documentation

## 2024-01-16 DIAGNOSIS — R7689 Other specified abnormal immunological findings in serum: Secondary | ICD-10-CM | POA: Insufficient documentation

## 2024-01-16 LAB — SPECIMEN STATUS REPORT

## 2024-01-16 LAB — THYROID PEROXIDASE ANTIBODY: Thyroperoxidase Ab SerPl-aCnc: 35 [IU]/mL — ABNORMAL HIGH (ref 0–34)

## 2024-02-19 ENCOUNTER — Encounter: Payer: Self-pay | Admitting: Family Medicine

## 2024-02-19 ENCOUNTER — Ambulatory Visit (INDEPENDENT_AMBULATORY_CARE_PROVIDER_SITE_OTHER): Payer: Self-pay | Admitting: Family Medicine

## 2024-02-19 VITALS — BP 124/81 | HR 65 | Temp 97.0°F | Ht 73.0 in | Wt 224.8 lb

## 2024-02-19 DIAGNOSIS — F411 Generalized anxiety disorder: Secondary | ICD-10-CM

## 2024-02-19 DIAGNOSIS — F5104 Psychophysiologic insomnia: Secondary | ICD-10-CM

## 2024-02-19 DIAGNOSIS — R7989 Other specified abnormal findings of blood chemistry: Secondary | ICD-10-CM

## 2024-02-19 DIAGNOSIS — R7689 Other specified abnormal immunological findings in serum: Secondary | ICD-10-CM

## 2024-02-19 MED ORDER — SERTRALINE HCL 50 MG PO TABS: 50.0000 mg | ORAL_TABLET | Freq: Every day | ORAL | 2 refills | Status: AC

## 2024-02-19 NOTE — Progress Notes (Signed)
 "    Subjective:  Patient ID: James Hendricks, male    DOB: 12/10/1985, 38 y.o.   MRN: [id number]  Patient Care Team: Severa Rock CHRISTELLA, FNP as PCP - General (Family Medicine)   Chief Complaint:  Anxiety (6 week follow )   HPI: James Hendricks is a 38 y.o. male presenting on 02/19/2024 for Anxiety (6 week follow )   James Hendricks is a 38 year old male who presents for follow-up on thyroid  function and anxiety management.  He has thyroid  abnormalities with recent lab results showing a slight elevation in thyroid  peroxidase antibodies (TPO) and a decrease in thyroid -stimulating hormone (TSH).  He has been taking sertraline  (Zoloft ) 50 mg daily for anxiety, which is effectively managing his symptoms. He has only needed to use an as-needed medication once and describes feeling 'absolutely fantastic' since starting the medication. He obtained his medication from [facility], using a GoodRx coupon, and paid $14 for both prescriptions.           02/19/2024   10:39 AM 01/08/2024    8:40 AM  Depression screen PHQ 2/9  Decreased Interest 0 1  Down, Depressed, Hopeless 0 2  PHQ - 2 Score 0 3  Altered sleeping 0 3  Tired, decreased energy 0 3  Change in appetite 0 3  Feeling bad or failure about yourself  0 3  Trouble concentrating 0 1  Moving slowly or fidgety/restless 0 0  Suicidal thoughts 0 0  PHQ-9 Score 0 16  Difficult doing work/chores Not difficult at all Somewhat difficult      02/19/2024   10:39 AM 01/08/2024    8:41 AM  GAD 7 : Generalized Anxiety Score  Nervous, Anxious, on Edge 0 3  Control/stop worrying 0 3  Worry too much - different things 0 3  Trouble relaxing 0 3  Restless 0 3  Easily annoyed or irritable 0 0  Afraid - awful might happen 0 3  Total GAD 7 Score 0 18  Anxiety Difficulty Not difficult at all Somewhat difficult       Relevant past medical, surgical, family, and social history reviewed and updated as indicated.  Allergies and medications  reviewed and updated. Data reviewed: Chart in Epic.   Past Medical History:  Diagnosis Date   Asthma     Past Surgical History:  Procedure Laterality Date   FRACTURE SURGERY      Social History   Socioeconomic History   Marital status: Married    Spouse name: Not on file   Number of children: Not on file   Years of education: Not on file   Highest education level: Not on file  Occupational History   Not on file  Tobacco Use   Smoking status: Former    Current packs/day: 1.00    Average packs/day: 1 pack/day for 9.0 years (9.0 ttl pk-yrs)    Types: Cigarettes    Start date: 2017    Quit date: 1997   Smokeless tobacco: Current  Vaping Use   Vaping status: Former   Quit date: 08/08/2023  Substance and Sexual Activity   Alcohol use: No   Drug use: Not Currently   Sexual activity: Yes  Other Topics Concern   Not on file  Social History Narrative   Not on file   Social Drivers of Health   Tobacco Use: High Risk (02/19/2024)   Patient History    Smoking Tobacco Use: Former    Smokeless Tobacco Use: Current  Passive Exposure: Not on file  Financial Resource Strain: Not on file  Food Insecurity: Not on file  Transportation Needs: Not on file  Physical Activity: Not on file  Stress: Not on file  Social Connections: Not on file  Intimate Partner Violence: Not on file  Depression (PHQ2-9): Low Risk (02/19/2024)   Depression (PHQ2-9)    PHQ-2 Score: 0  Recent Concern: Depression (PHQ2-9) - High Risk (01/08/2024)   Depression (PHQ2-9)    PHQ-2 Score: 16  Alcohol Screen: Not on file  Housing: Not on file  Utilities: Not on file  Health Literacy: Not on file    Outpatient Encounter Medications as of 02/19/2024  Medication Sig   hydrOXYzine  (ATARAX ) 10 MG tablet Take 1 tablet (10 mg total) by mouth 3 (three) times daily as needed for anxiety.   [DISCONTINUED] sertraline  (ZOLOFT ) 50 MG tablet Take 1 tablet (50 mg total) by mouth daily.   sertraline  (ZOLOFT ) 50  MG tablet Take 1 tablet (50 mg total) by mouth daily.   No facility-administered encounter medications on file as of 02/19/2024.    Allergies[1]  Pertinent ROS per HPI, otherwise unremarkable      Objective:  BP 124/81   Pulse 65   Temp (!) 97 F (36.1 C)   Ht 6' 1 (1.854 m)   Wt 224 lb 12.8 oz (102 kg)   SpO2 98%   BMI 29.66 kg/m    Wt Readings from Last 3 Encounters:  02/19/24 224 lb 12.8 oz (102 kg)  01/08/24 222 lb 6.4 oz (100.9 kg)  12/12/23 225 lb (102.1 kg)    Physical Exam Vitals and nursing note reviewed.  Constitutional:      Appearance: Normal appearance.  HENT:     Head: Normocephalic and atraumatic.     Mouth/Throat:     Mouth: Mucous membranes are moist.  Eyes:     Pupils: Pupils are equal, round, and reactive to light.  Cardiovascular:     Rate and Rhythm: Normal rate.  Pulmonary:     Effort: Pulmonary effort is normal.  Skin:    General: Skin is warm and dry.     Capillary Refill: Capillary refill takes less than 2 seconds.  Neurological:     General: No focal deficit present.     Mental Status: He is alert and oriented to person, place, and time.  Psychiatric:        Mood and Affect: Mood normal.        Behavior: Behavior normal.        Thought Content: Thought content normal.        Judgment: Judgment normal.      Results for orders placed or performed in visit on 01/08/24  Bayer DCA Hb A1c Waived   Collection Time: 01/08/24  8:56 AM  Result Value Ref Range   HB A1C (BAYER DCA - WAIVED) 5.2 4.8 - 5.6 %  Anemia Profile B   Collection Time: 01/08/24  8:58 AM  Result Value Ref Range   Total Iron Binding Capacity 348 250 - 450 ug/dL   UIBC 762 888 - 656 ug/dL   Iron 888 38 - 830 ug/dL   Iron Saturation 32 15 - 55 %   Ferritin 161 30 - 400 ng/mL   Vitamin B-12 531 232 - 1,245 pg/mL   Folate 14.2 >3.0 ng/mL   WBC 5.9 3.4 - 10.8 x10E3/uL   RBC 5.26 4.14 - 5.80 x10E6/uL   Hemoglobin 15.8 13.0 - 17.7 g/dL  Hematocrit 47.2 37.5 -  51.0 %   MCV 90 79 - 97 fL   MCH 30.0 26.6 - 33.0 pg   MCHC 33.5 31.5 - 35.7 g/dL   RDW 87.2 88.3 - 84.5 %   Platelets 213 150 - 450 x10E3/uL   Neutrophils 69 Not Estab. %   Lymphs 22 Not Estab. %   Monocytes 7 Not Estab. %   Eos 1 Not Estab. %   Basos 1 Not Estab. %   Neutrophils Absolute 4.1 1.4 - 7.0 x10E3/uL   Lymphocytes Absolute 1.3 0.7 - 3.1 x10E3/uL   Monocytes Absolute 0.4 0.1 - 0.9 x10E3/uL   EOS (ABSOLUTE) 0.1 0.0 - 0.4 x10E3/uL   Basophils Absolute 0.0 0.0 - 0.2 x10E3/uL   Immature Granulocytes 0 Not Estab. %   Immature Grans (Abs) 0.0 0.0 - 0.1 x10E3/uL   Retic Ct Pct 1.1 0.6 - 2.6 %  CMP14+EGFR   Collection Time: 01/08/24  8:58 AM  Result Value Ref Range   Glucose 93 70 - 99 mg/dL   BUN 18 6 - 20 mg/dL   Creatinine, Ser 8.85 0.76 - 1.27 mg/dL   eGFR 84 >40 fO/fpw/8.26   BUN/Creatinine Ratio 16 9 - 20   Sodium 141 134 - 144 mmol/L   Potassium 4.2 3.5 - 5.2 mmol/L   Chloride 102 96 - 106 mmol/L   CO2 24 20 - 29 mmol/L   Calcium 9.4 8.7 - 10.2 mg/dL   Total Protein 7.5 6.0 - 8.5 g/dL   Albumin 4.7 4.1 - 5.1 g/dL   Globulin, Total 2.8 1.5 - 4.5 g/dL   Bilirubin Total 0.6 0.0 - 1.2 mg/dL   Alkaline Phosphatase 67 47 - 123 IU/L   AST 18 0 - 40 IU/L   ALT 36 0 - 44 IU/L  TSH   Collection Time: 01/08/24  8:58 AM  Result Value Ref Range   TSH 0.306 (L) 0.450 - 4.500 uIU/mL  VITAMIN D  25 Hydroxy (Vit-D Deficiency, Fractures)   Collection Time: 01/08/24  8:58 AM  Result Value Ref Range   Vit D, 25-Hydroxy 28.8 (L) 30.0 - 100.0 ng/mL  T4, Free   Collection Time: 01/08/24  8:58 AM  Result Value Ref Range   Free T4 1.68 0.82 - 1.77 ng/dL  Thyroid  peroxidase antibody   Collection Time: 01/08/24  8:58 AM  Result Value Ref Range   Thyroperoxidase Ab SerPl-aCnc 35 (H) 0 - 34 IU/mL  Specimen status report   Collection Time: 01/08/24  8:58 AM  Result Value Ref Range   specimen status report Comment        Pertinent labs & imaging results that were available  during my care of the patient were reviewed by me and considered in my medical decision making.  Assessment & Plan:  Kolbi was seen today for anxiety.  Diagnoses and all orders for this visit:  GAD (generalized anxiety disorder) -     sertraline  (ZOLOFT ) 50 MG tablet; Take 1 tablet (50 mg total) by mouth daily.  Psychophysiological insomnia -     sertraline  (ZOLOFT ) 50 MG tablet; Take 1 tablet (50 mg total) by mouth daily.  Anti-TPO antibodies present Abnormal TSH Has been referred to endocrinology, awaiting appointment.        Generalized anxiety disorder Well-managed with sertraline  50 mg daily. He reports excellent response with minimal use of as-needed medication. - Continue sertraline  50 mg daily. - Sent in refills for sertraline . - Plan to trial tapering off sertraline  in 6-8 months  if anxiety remains controlled.  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 No significant concern at this time. - Referred to endocrinologist for further evaluation and management. - Await endocrinologist's recommendations for potential treatment or monitoring.          Continue all other maintenance medications.  Follow up plan: Return in about 6 months (around 08/19/2024).   Continue healthy lifestyle choices, including diet (rich in fruits, vegetables, and lean proteins, and low in salt and simple carbohydrates) and exercise (at least 30 minutes of moderate physical activity daily).  Educational handout given for GAD  The above assessment and management plan was discussed with the patient. The patient verbalized understanding of and has agreed to the management plan. Patient is aware to call the clinic if they develop any new symptoms or if symptoms persist or worsen. Patient is aware when to return to the clinic for a follow-up visit. Patient educated on when it  is appropriate to go to the emergency department.   Rosaline Bruns, FNP-C [facility] [phone]     [1]  Allergies Allergen Reactions   Shellfish Allergy     Swelling,    "

## 2024-07-03 ENCOUNTER — Ambulatory Visit: Payer: Self-pay | Admitting: Endocrinology

## 2024-08-19 ENCOUNTER — Ambulatory Visit: Payer: Self-pay | Admitting: Family Medicine
